# Patient Record
Sex: Male | Born: 1950 | Race: White | Hispanic: No | Marital: Married | Smoking: Never smoker
Health system: Southern US, Community
[De-identification: ages and names within clinical notes are randomized; demographics above are authoritative.]

## PROBLEM LIST (undated history)

## (undated) ENCOUNTER — Emergency Department: Admission: EM

## (undated) DIAGNOSIS — T8859XA Other complications of anesthesia, initial encounter: Secondary | ICD-10-CM

## (undated) DIAGNOSIS — I10 Essential (primary) hypertension: Secondary | ICD-10-CM

## (undated) DIAGNOSIS — M1990 Unspecified osteoarthritis, unspecified site: Secondary | ICD-10-CM

## (undated) DIAGNOSIS — R112 Nausea with vomiting, unspecified: Secondary | ICD-10-CM

## (undated) DIAGNOSIS — Z9889 Other specified postprocedural states: Secondary | ICD-10-CM

## (undated) DIAGNOSIS — T4145XA Adverse effect of unspecified anesthetic, initial encounter: Secondary | ICD-10-CM

## (undated) DIAGNOSIS — E039 Hypothyroidism, unspecified: Secondary | ICD-10-CM

## (undated) HISTORY — PX: CHOLECYSTECTOMY: SHX55

## (undated) HISTORY — PX: KNEE SURGERY: SHX244

## (undated) HISTORY — DX: Essential (primary) hypertension: I10

## (undated) HISTORY — PX: APPENDECTOMY: SHX54

## (undated) HISTORY — PX: SHOULDER SURGERY: SHX246

## (undated) HISTORY — PX: TOTAL SHOULDER REPLACEMENT: SUR1217

---
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
## 2003-10-27 ENCOUNTER — Other Ambulatory Visit: Payer: Self-pay

## 2005-01-15 ENCOUNTER — Ambulatory Visit: Payer: Self-pay | Admitting: Orthopaedic Surgery

## 2005-02-13 ENCOUNTER — Encounter: Payer: Self-pay | Admitting: Orthopaedic Surgery

## 2005-09-26 ENCOUNTER — Encounter: Payer: Self-pay | Admitting: Orthopaedic Surgery

## 2005-10-04 ENCOUNTER — Encounter: Payer: Self-pay | Admitting: Orthopaedic Surgery

## 2005-11-04 ENCOUNTER — Encounter: Payer: Self-pay | Admitting: Orthopaedic Surgery

## 2005-12-11 ENCOUNTER — Other Ambulatory Visit: Payer: Self-pay

## 2005-12-11 ENCOUNTER — Ambulatory Visit: Payer: Self-pay | Admitting: Internal Medicine

## 2006-02-18 ENCOUNTER — Other Ambulatory Visit: Payer: Self-pay

## 2006-02-18 ENCOUNTER — Ambulatory Visit: Payer: Self-pay | Admitting: Internal Medicine

## 2011-11-30 ENCOUNTER — Ambulatory Visit: Payer: Self-pay | Admitting: Unknown Physician Specialty

## 2012-02-11 ENCOUNTER — Encounter: Payer: Self-pay | Admitting: Orthopaedic Surgery

## 2012-03-06 ENCOUNTER — Encounter: Payer: Self-pay | Admitting: Orthopaedic Surgery

## 2012-05-20 ENCOUNTER — Ambulatory Visit: Payer: Self-pay | Admitting: Orthopaedic Surgery

## 2012-07-10 ENCOUNTER — Encounter: Payer: Self-pay | Admitting: Orthopaedic Surgery

## 2012-08-06 ENCOUNTER — Encounter: Payer: Self-pay | Admitting: Orthopaedic Surgery

## 2012-09-06 ENCOUNTER — Encounter: Payer: Self-pay | Admitting: Orthopaedic Surgery

## 2012-10-04 ENCOUNTER — Encounter: Payer: Self-pay | Admitting: Orthopaedic Surgery

## 2013-12-18 ENCOUNTER — Ambulatory Visit: Payer: Self-pay | Admitting: Orthopedic Surgery

## 2014-07-28 DIAGNOSIS — R079 Chest pain, unspecified: Secondary | ICD-10-CM | POA: Diagnosis not present

## 2014-07-28 DIAGNOSIS — I10 Essential (primary) hypertension: Secondary | ICD-10-CM | POA: Diagnosis not present

## 2014-08-09 ENCOUNTER — Ambulatory Visit: Payer: Self-pay | Admitting: Family Medicine

## 2014-08-09 DIAGNOSIS — I2510 Atherosclerotic heart disease of native coronary artery without angina pectoris: Secondary | ICD-10-CM | POA: Diagnosis not present

## 2014-08-09 DIAGNOSIS — R079 Chest pain, unspecified: Secondary | ICD-10-CM | POA: Diagnosis not present

## 2014-09-02 DIAGNOSIS — I10 Essential (primary) hypertension: Secondary | ICD-10-CM | POA: Diagnosis not present

## 2014-09-09 DIAGNOSIS — D485 Neoplasm of uncertain behavior of skin: Secondary | ICD-10-CM | POA: Diagnosis not present

## 2014-09-09 DIAGNOSIS — L821 Other seborrheic keratosis: Secondary | ICD-10-CM | POA: Diagnosis not present

## 2014-09-09 DIAGNOSIS — L578 Other skin changes due to chronic exposure to nonionizing radiation: Secondary | ICD-10-CM | POA: Diagnosis not present

## 2014-09-09 DIAGNOSIS — L570 Actinic keratosis: Secondary | ICD-10-CM | POA: Diagnosis not present

## 2014-09-09 DIAGNOSIS — X32XXXA Exposure to sunlight, initial encounter: Secondary | ICD-10-CM | POA: Diagnosis not present

## 2014-11-04 DIAGNOSIS — M7541 Impingement syndrome of right shoulder: Secondary | ICD-10-CM | POA: Diagnosis not present

## 2014-11-04 DIAGNOSIS — M19011 Primary osteoarthritis, right shoulder: Secondary | ICD-10-CM | POA: Diagnosis not present

## 2015-04-29 ENCOUNTER — Other Ambulatory Visit: Payer: Self-pay | Admitting: Family Medicine

## 2015-04-29 DIAGNOSIS — I10 Essential (primary) hypertension: Secondary | ICD-10-CM | POA: Insufficient documentation

## 2015-05-05 ENCOUNTER — Telehealth: Payer: Self-pay | Admitting: Family Medicine

## 2015-05-05 MED ORDER — LISINOPRIL 20 MG PO TABS: 20.0000 mg | ORAL_TABLET | Freq: Every day | ORAL | Status: DC

## 2015-05-05 NOTE — Telephone Encounter (Signed)
Pt called stated refill on Lisinopril. Pharm is [facility]. Thanks.

## 2015-05-05 NOTE — Telephone Encounter (Signed)
Forward to provider

## 2015-09-08 DIAGNOSIS — L821 Other seborrheic keratosis: Secondary | ICD-10-CM | POA: Diagnosis not present

## 2015-09-08 DIAGNOSIS — D2271 Melanocytic nevi of right lower limb, including hip: Secondary | ICD-10-CM | POA: Diagnosis not present

## 2015-09-08 DIAGNOSIS — X32XXXA Exposure to sunlight, initial encounter: Secondary | ICD-10-CM | POA: Diagnosis not present

## 2015-09-08 DIAGNOSIS — D225 Melanocytic nevi of trunk: Secondary | ICD-10-CM | POA: Diagnosis not present

## 2015-09-08 DIAGNOSIS — D2272 Melanocytic nevi of left lower limb, including hip: Secondary | ICD-10-CM | POA: Diagnosis not present

## 2015-09-08 DIAGNOSIS — L570 Actinic keratosis: Secondary | ICD-10-CM | POA: Diagnosis not present

## 2015-10-17 ENCOUNTER — Other Ambulatory Visit: Payer: Self-pay | Admitting: Family Medicine

## 2015-10-17 MED ORDER — TADALAFIL 5 MG PO TABS: 5.0000 mg | ORAL_TABLET | Freq: Every day | ORAL | Status: DC | PRN

## 2015-11-28 ENCOUNTER — Other Ambulatory Visit: Payer: Self-pay | Admitting: Family Medicine

## 2015-11-28 MED ORDER — LISINOPRIL 20 MG PO TABS: 20.0000 mg | ORAL_TABLET | Freq: Every day | ORAL | Status: DC

## 2016-01-05 ENCOUNTER — Encounter (INDEPENDENT_AMBULATORY_CARE_PROVIDER_SITE_OTHER): Payer: Self-pay | Admitting: Family Medicine

## 2016-01-05 DIAGNOSIS — Z0289 Encounter for other administrative examinations: Secondary | ICD-10-CM

## 2016-01-05 NOTE — Progress Notes (Unsigned)
FAA class 2 PE

## 2016-01-26 ENCOUNTER — Ambulatory Visit (INDEPENDENT_AMBULATORY_CARE_PROVIDER_SITE_OTHER): Payer: Medicare Other | Admitting: Family Medicine

## 2016-01-26 ENCOUNTER — Encounter: Payer: Self-pay | Admitting: Family Medicine

## 2016-01-26 VITALS — BP 138/79 | HR 60 | Temp 97.9°F | Ht 70.0 in | Wt 203.0 lb

## 2016-01-26 DIAGNOSIS — Z0000 Encounter for general adult medical examination without abnormal findings: Secondary | ICD-10-CM | POA: Diagnosis not present

## 2016-01-26 DIAGNOSIS — R5383 Other fatigue: Secondary | ICD-10-CM | POA: Diagnosis not present

## 2016-01-26 DIAGNOSIS — I10 Essential (primary) hypertension: Secondary | ICD-10-CM

## 2016-01-26 DIAGNOSIS — M25519 Pain in unspecified shoulder: Secondary | ICD-10-CM | POA: Insufficient documentation

## 2016-01-26 DIAGNOSIS — N400 Enlarged prostate without lower urinary tract symptoms: Secondary | ICD-10-CM | POA: Diagnosis not present

## 2016-01-26 LAB — URINALYSIS, ROUTINE W REFLEX MICROSCOPIC
Bilirubin, UA: NEGATIVE
GLUCOSE, UA: NEGATIVE
Ketones, UA: NEGATIVE
Nitrite, UA: NEGATIVE
PH UA: 6 (ref 5.0–7.5)
PROTEIN UA: NEGATIVE
SPEC GRAV UA: 1.02 (ref 1.005–1.030)
Urobilinogen, Ur: 0.2 mg/dL (ref 0.2–1.0)

## 2016-01-26 LAB — MICROSCOPIC EXAMINATION

## 2016-01-26 MED ORDER — LISINOPRIL 20 MG PO TABS: 20.0000 mg | ORAL_TABLET | Freq: Every day | ORAL | Status: DC

## 2016-01-26 MED ORDER — SILDENAFIL CITRATE 20 MG PO TABS: 20.0000 mg | ORAL_TABLET | Freq: Every day | ORAL | Status: DC | PRN

## 2016-01-26 NOTE — Assessment & Plan Note (Signed)
The current medical regimen is effective;  continue present plan and medications.  

## 2016-01-26 NOTE — Progress Notes (Signed)
BP 138/79 mmHg  Pulse 60  Temp(Src) 97.9 F (36.6 C)  Ht 5\' 10"  (1.778 m)  Wt 203 lb (92.08 kg)  BMI 29.13 kg/m2  SpO2 95%   Subjective:    Patient ID: Tyler Sharp, male    DOB: 04-15-1951, 65 y.o.   MRN: [id number]  HPI: Tyler Sharp is a 65 y.o. male  Chief Complaint  Patient presents with  . Annual Exam   Patient presents for his annual exam today. Doing very well overall.  HTN - BPs WNL when checked. No issues or concerns with lisinopril, taking medication faithfully Shoulder pain - chronic, following with orthopedics. Not yet to the point where he wants joint replacement. Fairly well controlled with ibuprofen as needed.   Relevant past medical, surgical, family and social history reviewed and updated as indicated. Interim medical history since our last visit reviewed. Allergies and medications reviewed and updated.  Review of Systems  Constitutional: Negative.   HENT: Negative.   Eyes: Negative.   Respiratory: Negative.   Cardiovascular: Negative.   Gastrointestinal: Negative.   Endocrine: Negative.   Genitourinary: Negative.   Musculoskeletal: Positive for arthralgias.  Skin: Negative.   Allergic/Immunologic: Negative.   Neurological: Negative.   Hematological: Negative.   Psychiatric/Behavioral: Negative.     Per HPI unless specifically indicated above     Objective:    BP 138/79 mmHg  Pulse 60  Temp(Src) 97.9 F (36.6 C)  Ht 5\' 10"  (1.778 m)  Wt 203 lb (92.08 kg)  BMI 29.13 kg/m2  SpO2 95%  Wt Readings from Last 3 Encounters:  01/26/16 203 lb (92.08 kg)  01/05/16 205 lb (92.987 kg)  09/02/14 199 lb (90.266 kg)    Physical Exam  Constitutional: He is oriented to person, place, and time. He appears well-developed and well-nourished.  HENT:  Head: Normocephalic and atraumatic.  Right Ear: External ear normal.  Left Ear: External ear normal.  Eyes: Conjunctivae and EOM are normal. Pupils are equal, round, and reactive to light.  Neck:  Normal range of motion. Neck supple.  Cardiovascular: Normal rate, regular rhythm, normal heart sounds and intact distal pulses.   Pulmonary/Chest: Effort normal and breath sounds normal.  Abdominal: Soft. Bowel sounds are normal. There is no splenomegaly or hepatomegaly.  Genitourinary: Rectum normal, prostate normal and penis normal.  Musculoskeletal: Normal range of motion.  Neurological: He is alert and oriented to person, place, and time. He has normal reflexes.  Skin: No rash noted. No erythema.  Psychiatric: He has a normal mood and affect. His behavior is normal. Judgment and thought content normal.    No results found for this or any previous visit.    Assessment & Plan:   Problem List Items Addressed This Visit      Cardiovascular and Mediastinum   Hypertension    The current medical regimen is effective;  continue present plan and medications.       Relevant Medications   lisinopril (PRINIVIL,ZESTRIL) 20 MG tablet   sildenafil (REVATIO) 20 MG tablet     Other   Shoulder pain    Other Visit Diagnoses    Well adult exam    -  Primary    Relevant Orders    CBC with Differential/Platelet    Comprehensive metabolic panel    Lipid Panel w/o Chol/HDL Ratio    PSA    TSH    Urinalysis, Routine w reflex microscopic (not at [facility])    Other fatigue  Relevant Orders    CBC with Differential/Platelet    TSH    Enlarged prostate        Relevant Orders    PSA    Healthcare maintenance        Relevant Orders    Hepatitis C Antibody        Follow up plan: Return in about 6 months (around 07/27/2016) for BMP.

## 2016-01-27 ENCOUNTER — Other Ambulatory Visit: Payer: Self-pay | Admitting: Family Medicine

## 2016-01-27 ENCOUNTER — Telehealth: Payer: Self-pay | Admitting: Family Medicine

## 2016-01-27 DIAGNOSIS — R7989 Other specified abnormal findings of blood chemistry: Secondary | ICD-10-CM

## 2016-01-27 LAB — CBC WITH DIFFERENTIAL/PLATELET
BASOS ABS: 0 10*3/uL (ref 0.0–0.2)
BASOS: 1 %
EOS (ABSOLUTE): 0.2 10*3/uL (ref 0.0–0.4)
Eos: 3 %
HEMOGLOBIN: 13.2 g/dL (ref 12.6–17.7)
Hematocrit: 40 % (ref 37.5–51.0)
IMMATURE GRANS (ABS): 0 10*3/uL (ref 0.0–0.1)
Immature Granulocytes: 0 %
LYMPHS ABS: 2.4 10*3/uL (ref 0.7–3.1)
Lymphs: 42 %
MCH: 30.2 pg (ref 26.6–33.0)
MCHC: 33 g/dL (ref 31.5–35.7)
MCV: 92 fL (ref 79–97)
MONOCYTES: 6 %
Monocytes Absolute: 0.3 10*3/uL (ref 0.1–0.9)
NEUTROS ABS: 2.8 10*3/uL (ref 1.4–7.0)
Neutrophils: 48 %
Platelets: 234 10*3/uL (ref 150–379)
RBC: 4.37 x10E6/uL (ref 4.14–5.80)
RDW: 13.6 % (ref 12.3–15.4)
WBC: 5.7 10*3/uL (ref 3.4–10.8)

## 2016-01-27 LAB — COMPREHENSIVE METABOLIC PANEL
A/G RATIO: 1.9 (ref 1.2–2.2)
ALBUMIN: 4.5 g/dL (ref 3.6–4.8)
ALK PHOS: 51 IU/L (ref 39–117)
ALT: 26 IU/L (ref 0–44)
AST: 30 IU/L (ref 0–40)
BILIRUBIN TOTAL: 0.3 mg/dL (ref 0.0–1.2)
BUN / CREAT RATIO: 19 (ref 10–24)
BUN: 17 mg/dL (ref 8–27)
CHLORIDE: 100 mmol/L (ref 96–106)
CO2: 22 mmol/L (ref 18–29)
Calcium: 9.5 mg/dL (ref 8.6–10.2)
Creatinine, Ser: 0.91 mg/dL (ref 0.76–1.27)
GFR calc non Af Amer: 88 mL/min/{1.73_m2} (ref >59)
GFR, EST AFRICAN AMERICAN: 101 mL/min/{1.73_m2} (ref >59)
GLOBULIN, TOTAL: 2.4 g/dL (ref 1.5–4.5)
GLUCOSE: 92 mg/dL (ref 65–99)
POTASSIUM: 5 mmol/L (ref 3.5–5.2)
SODIUM: 139 mmol/L (ref 134–144)
TOTAL PROTEIN: 6.9 g/dL (ref 6.0–8.5)

## 2016-01-27 LAB — LIPID PANEL W/O CHOL/HDL RATIO
CHOLESTEROL TOTAL: 196 mg/dL (ref 100–199)
HDL: 48 mg/dL (ref >39)
LDL Calculated: 119 mg/dL — ABNORMAL HIGH (ref 0–99)
Triglycerides: 146 mg/dL (ref 0–149)
VLDL CHOLESTEROL CAL: 29 mg/dL (ref 5–40)

## 2016-01-27 LAB — HEPATITIS C ANTIBODY

## 2016-01-27 LAB — PSA: Prostate Specific Ag, Serum: 1.9 ng/mL (ref 0.0–4.0)

## 2016-01-27 LAB — TSH: TSH: 6.41 u[IU]/mL — AB (ref 0.450–4.500)

## 2016-01-27 NOTE — Telephone Encounter (Signed)
Thyroid very mildly elevated, please have him come back for a lab visit in 2-3 months to recheck thyroid function. Thanks!

## 2016-01-31 NOTE — Telephone Encounter (Signed)
Have tried calling patient to let him know about his bloodwork but no answer and no VM available.

## 2016-02-01 NOTE — Telephone Encounter (Signed)
Spoke with patient, he will come in for labs sometime in September

## 2016-06-06 DIAGNOSIS — R208 Other disturbances of skin sensation: Secondary | ICD-10-CM | POA: Diagnosis not present

## 2016-06-06 DIAGNOSIS — L570 Actinic keratosis: Secondary | ICD-10-CM | POA: Diagnosis not present

## 2016-06-06 DIAGNOSIS — L820 Inflamed seborrheic keratosis: Secondary | ICD-10-CM | POA: Diagnosis not present

## 2016-06-11 ENCOUNTER — Other Ambulatory Visit: Payer: Medicare Other

## 2016-06-11 DIAGNOSIS — R946 Abnormal results of thyroid function studies: Secondary | ICD-10-CM | POA: Diagnosis not present

## 2016-06-11 DIAGNOSIS — R7989 Other specified abnormal findings of blood chemistry: Secondary | ICD-10-CM

## 2016-06-12 LAB — THYROID PANEL WITH TSH
Free Thyroxine Index: 1.3 (ref 1.2–4.9)
T3 UPTAKE RATIO: 24 % (ref 24–39)
T4, Total: 5.6 ug/dL (ref 4.5–12.0)
TSH: 6.2 u[IU]/mL — ABNORMAL HIGH (ref 0.450–4.500)

## 2016-06-13 ENCOUNTER — Telehealth: Payer: Self-pay | Admitting: Family Medicine

## 2016-06-13 NOTE — Telephone Encounter (Signed)
Please call pt and let him know that his thyroid is still mildly elevated. Dr. Jeananne Rama can discuss options at his upcoming follow up next month. As long as he is asymptomatic, no need to treat unless it worsens. Will continue to monitor.

## 2016-06-14 NOTE — Telephone Encounter (Signed)
Patient notified.  Explained to patient to call us if he experience's any new symptoms, but wait to treat till he see's Dr. Jeananne Rama next month. Patient understood.

## 2016-07-26 ENCOUNTER — Encounter: Payer: Self-pay | Admitting: Family Medicine

## 2016-07-26 ENCOUNTER — Ambulatory Visit (INDEPENDENT_AMBULATORY_CARE_PROVIDER_SITE_OTHER): Payer: Medicare Other | Admitting: Family Medicine

## 2016-07-26 VITALS — BP 138/85 | HR 52 | Temp 97.9°F | Wt 206.6 lb

## 2016-07-26 DIAGNOSIS — G8929 Other chronic pain: Secondary | ICD-10-CM

## 2016-07-26 DIAGNOSIS — I10 Essential (primary) hypertension: Secondary | ICD-10-CM | POA: Diagnosis not present

## 2016-07-26 DIAGNOSIS — M25512 Pain in left shoulder: Secondary | ICD-10-CM | POA: Diagnosis not present

## 2016-07-26 NOTE — Progress Notes (Signed)
   BP 138/85 (BP Location: Left Arm, Patient Position: Sitting, Cuff Size: Large)   Pulse (!) 52   Temp 97.9 F (36.6 C)   Wt 206 lb 9.6 oz (93.7 kg)   SpO2 100%   BMI 29.64 kg/m    Subjective:    Patient ID: Tyler Sharp, male    DOB: 06-Jul-1951, 65 y.o.   MRN: [id number]  HPI: Tyler Sharp is a 65 y.o. male  Chief Complaint  Patient presents with  . Hypertension  Patient doing well with no complaints from medications taken faithfully with good control of blood pressure No thyroid symptoms last TSH was coming down but still elevated.  Relevant past medical, surgical, family and social history reviewed and updated as indicated. Interim medical history since our last visit reviewed. Allergies and medications reviewed and updated.  Review of Systems  Constitutional: Negative.   Respiratory: Negative.   Cardiovascular: Negative.     Per HPI unless specifically indicated above     Objective:    BP 138/85 (BP Location: Left Arm, Patient Position: Sitting, Cuff Size: Large)   Pulse (!) 52   Temp 97.9 F (36.6 C)   Wt 206 lb 9.6 oz (93.7 kg)   SpO2 100%   BMI 29.64 kg/m   Wt Readings from Last 3 Encounters:  07/26/16 206 lb 9.6 oz (93.7 kg)  01/26/16 203 lb (92.1 kg)  01/05/16 205 lb (93 kg)    Physical Exam  Constitutional: He is oriented to person, place, and time. He appears well-developed and well-nourished. No distress.  HENT:  Head: Normocephalic and atraumatic.  Right Ear: Hearing normal.  Left Ear: Hearing normal.  Nose: Nose normal.  Eyes: Conjunctivae and lids are normal. Right eye exhibits no discharge. Left eye exhibits no discharge. No scleral icterus.  Cardiovascular: Normal rate, regular rhythm and normal heart sounds.   Pulmonary/Chest: Effort normal and breath sounds normal. No respiratory distress.  Musculoskeletal: Normal range of motion.  Neurological: He is alert and oriented to person, place, and time.  Skin: Skin is intact. No rash noted.    Psychiatric: He has a normal mood and affect. His speech is normal and behavior is normal. Judgment and thought content normal. Cognition and memory are normal.    Results for orders placed or performed in visit on 06/11/16  Thyroid Panel With TSH  Result Value Ref Range   TSH 6.200 (H) 0.450 - 4.500 uIU/mL   T4, Total 5.6 4.5 - 12.0 ug/dL   T3 Uptake Ratio 24 24 - 39 %   Free Thyroxine Index 1.3 1.2 - 4.9      Assessment & Plan:   Problem List Items Addressed This Visit      Cardiovascular and Mediastinum   Hypertension - Primary    The current medical regimen is effective;  continue present plan and medications.       Relevant Orders   Basic metabolic panel     Other   Shoulder pain    stable        Elevated TSH will repeat  on physical  Follow up plan: Return in about 6 months (around 01/24/2017) for Physical Exam.

## 2016-07-26 NOTE — Assessment & Plan Note (Signed)
stable °

## 2016-07-26 NOTE — Assessment & Plan Note (Signed)
The current medical regimen is effective;  continue present plan and medications.  

## 2016-07-27 LAB — BASIC METABOLIC PANEL
BUN / CREAT RATIO: 16 (ref 10–24)
BUN: 15 mg/dL (ref 8–27)
CO2: 24 mmol/L (ref 18–29)
CREATININE: 0.92 mg/dL (ref 0.76–1.27)
Calcium: 9.7 mg/dL (ref 8.6–10.2)
Chloride: 97 mmol/L (ref 96–106)
GFR calc Af Amer: 99 mL/min/{1.73_m2} (ref >59)
GFR, EST NON AFRICAN AMERICAN: 86 mL/min/{1.73_m2} (ref >59)
GLUCOSE: 106 mg/dL — AB (ref 65–99)
Potassium: 5.4 mmol/L — ABNORMAL HIGH (ref 3.5–5.2)
SODIUM: 136 mmol/L (ref 134–144)

## 2016-08-01 ENCOUNTER — Telehealth: Payer: Self-pay | Admitting: Family Medicine

## 2016-08-01 DIAGNOSIS — E875 Hyperkalemia: Secondary | ICD-10-CM

## 2016-08-01 NOTE — Telephone Encounter (Signed)
Please let him know that his potassium was a little high. I'd like him to avoid bananas, citrus and salt substitutes and come back in 2 weeks for a recheck. Order in.

## 2016-08-01 NOTE — Telephone Encounter (Signed)
Called and let patient know what Dr. Wynetta Emery said about labs.

## 2016-09-06 ENCOUNTER — Other Ambulatory Visit: Payer: Medicare Other

## 2016-09-06 DIAGNOSIS — L570 Actinic keratosis: Secondary | ICD-10-CM | POA: Diagnosis not present

## 2016-09-06 DIAGNOSIS — B351 Tinea unguium: Secondary | ICD-10-CM | POA: Diagnosis not present

## 2016-09-06 DIAGNOSIS — E875 Hyperkalemia: Secondary | ICD-10-CM

## 2016-09-06 DIAGNOSIS — X32XXXA Exposure to sunlight, initial encounter: Secondary | ICD-10-CM | POA: Diagnosis not present

## 2016-09-07 ENCOUNTER — Encounter: Payer: Self-pay | Admitting: Family Medicine

## 2016-09-07 LAB — BASIC METABOLIC PANEL
BUN / CREAT RATIO: 15 (ref 10–24)
BUN: 14 mg/dL (ref 8–27)
CHLORIDE: 101 mmol/L (ref 96–106)
CO2: 26 mmol/L (ref 18–29)
Calcium: 9.4 mg/dL (ref 8.6–10.2)
Creatinine, Ser: 0.92 mg/dL (ref 0.76–1.27)
GFR calc non Af Amer: 86 mL/min/{1.73_m2} (ref >59)
GFR, EST AFRICAN AMERICAN: 99 mL/min/{1.73_m2} (ref >59)
Glucose: 118 mg/dL — ABNORMAL HIGH (ref 65–99)
Potassium: 5.2 mmol/L (ref 3.5–5.2)
SODIUM: 141 mmol/L (ref 134–144)

## 2016-11-28 ENCOUNTER — Other Ambulatory Visit: Payer: Self-pay | Admitting: Family Medicine

## 2016-11-28 MED ORDER — NEOMYCIN-POLYMYXIN-HC 3.5-10000-1 OT SOLN: 4.0000 [drp] | Freq: Four times a day (QID) | OTIC | 1 refills | Status: DC

## 2016-12-14 ENCOUNTER — Other Ambulatory Visit: Payer: Self-pay | Admitting: Family Medicine

## 2016-12-14 MED ORDER — NEOMYCIN-POLYMYXIN-HC 3.5-10000-1 OT SOLN: 4.0000 [drp] | Freq: Four times a day (QID) | OTIC | 1 refills | Status: DC

## 2016-12-14 NOTE — Telephone Encounter (Signed)
Patient needs Dr Jeananne Rama to send in a script to [facility] for the ear drops.  Thanks  [phone]

## 2017-01-01 ENCOUNTER — Telehealth: Payer: Self-pay | Admitting: Family Medicine

## 2017-01-01 NOTE — Telephone Encounter (Signed)
Neomycin-Polymyxin-HC 4 drops Left Ear 4 times daily was sent to mail order pharmacy by rachel lane on 12/14/16

## 2017-01-01 NOTE — Telephone Encounter (Signed)
Patient called to check on a script for his ear drops that he said he had spoken with someone to get sent to his mail order pharmacy [facility].  I told him I would make sure Corey Skains received this message today to see if Dr Jeananne Rama could get this taken care of ASAP.  Thank you

## 2017-01-03 ENCOUNTER — Encounter: Payer: Self-pay | Admitting: Family Medicine

## 2017-01-03 ENCOUNTER — Ambulatory Visit (INDEPENDENT_AMBULATORY_CARE_PROVIDER_SITE_OTHER): Payer: Medicare Other | Admitting: Family Medicine

## 2017-01-03 VITALS — BP 138/82 | HR 78 | Temp 98.2°F | Wt 207.8 lb

## 2017-01-03 DIAGNOSIS — J019 Acute sinusitis, unspecified: Secondary | ICD-10-CM

## 2017-01-03 DIAGNOSIS — I10 Essential (primary) hypertension: Secondary | ICD-10-CM | POA: Diagnosis not present

## 2017-01-03 MED ORDER — FEXOFENADINE-PSEUDOEPHED ER 60-120 MG PO TB12: 1.0000 | ORAL_TABLET | Freq: Two times a day (BID) | ORAL | 1 refills | Status: DC

## 2017-01-03 MED ORDER — BENZONATATE 100 MG PO CAPS: 100.0000 mg | ORAL_CAPSULE | Freq: Two times a day (BID) | ORAL | 0 refills | Status: DC | PRN

## 2017-01-03 MED ORDER — AMOXICILLIN-POT CLAVULANATE 875-125 MG PO TABS: 1.0000 | ORAL_TABLET | Freq: Two times a day (BID) | ORAL | 0 refills | Status: DC

## 2017-01-03 NOTE — Assessment & Plan Note (Signed)
Discussed sinusitis care and treatment discuss aviation and implications and flight. Discuss use of medications including nasal rinse.

## 2017-01-03 NOTE — Progress Notes (Signed)
BP 138/82   Pulse 78   Temp 98.2 F (36.8 C) (Oral)   Wt 207 lb 12.8 oz (94.3 kg)   SpO2 97%   BMI 29.82 kg/m    Subjective:    Patient ID: Tyler Sharp, male    DOB: 1951/05/11, 66 y.o.   MRN: [id number]  HPI: Tyler Sharp is a 66 y.o. male  Chief Complaint  Patient presents with  . Otalgia  . Sore Throat  . Headache   Patient's sinus pressure congestion and facial pain is been ongoing for over a week getting worse. Has had some left earache for over a month. As tried some over-the-counter medications without any real relief. Otherwise blood pressures been doing well. Relevant past medical, surgical, family and social history reviewed and updated as indicated. Interim medical history since our last visit reviewed. Allergies and medications reviewed and updated.  Review of Systems  Constitutional: Positive for chills, diaphoresis and fatigue. Negative for fever.  HENT: Positive for congestion, ear pain, postnasal drip, rhinorrhea, sinus pain, sinus pressure, sore throat and voice change.   Respiratory: Positive for cough.   Cardiovascular: Negative.     Per HPI unless specifically indicated above     Objective:    BP 138/82   Pulse 78   Temp 98.2 F (36.8 C) (Oral)   Wt 207 lb 12.8 oz (94.3 kg)   SpO2 97%   BMI 29.82 kg/m   Wt Readings from Last 3 Encounters:  01/03/17 207 lb 12.8 oz (94.3 kg)  07/26/16 206 lb 9.6 oz (93.7 kg)  01/26/16 203 lb (92.1 kg)    Physical Exam  Constitutional: He is oriented to person, place, and time. He appears well-developed and well-nourished.  HENT:  Head: Normocephalic and atraumatic.  Right Ear: External ear normal.  Left Ear: External ear normal.  Mouth/Throat: Oropharyngeal exudate present.  Eyes: Conjunctivae and EOM are normal.  Neck: Normal range of motion.  Cardiovascular: Normal rate, regular rhythm and normal heart sounds.   Pulmonary/Chest: Effort normal and breath sounds normal.  Musculoskeletal: Normal  range of motion.  Neurological: He is alert and oriented to person, place, and time.  Skin: No erythema.  Psychiatric: He has a normal mood and affect. His behavior is normal. Judgment and thought content normal.    Results for orders placed or performed in visit on 62/83/15  Basic metabolic panel  Result Value Ref Range   Glucose 118 (H) 65 - 99 mg/dL   BUN 14 8 - 27 mg/dL   Creatinine, Ser 0.92 0.76 - 1.27 mg/dL   GFR calc non Af Amer 86 >59 mL/min/1.73   GFR calc Af Amer 99 >59 mL/min/1.73   BUN/Creatinine Ratio 15 10 - 24   Sodium 141 134 - 144 mmol/L   Potassium 5.2 3.5 - 5.2 mmol/L   Chloride 101 96 - 106 mmol/L   CO2 26 18 - 29 mmol/L   Calcium 9.4 8.6 - 10.2 mg/dL      Assessment & Plan:   Problem List Items Addressed This Visit      Cardiovascular and Mediastinum   Hypertension    The current medical regimen is effective;  continue present plan and medications.         Respiratory   Acute sinusitis - Primary    Discussed sinusitis care and treatment discuss aviation and implications and flight. Discuss use of medications including nasal rinse.      Relevant Medications   amoxicillin-clavulanate (AUGMENTIN) 875-125 MG  tablet   benzonatate (TESSALON) 100 MG capsule   fexofenadine-pseudoephedrine (ALLEGRA-D) 60-120 MG 12 hr tablet       Follow up plan: Return for As scheduled.

## 2017-01-03 NOTE — Assessment & Plan Note (Signed)
The current medical regimen is effective;  continue present plan and medications.  

## 2017-01-04 ENCOUNTER — Telehealth: Payer: Self-pay | Admitting: Family Medicine

## 2017-01-07 DIAGNOSIS — G5622 Lesion of ulnar nerve, left upper limb: Secondary | ICD-10-CM | POA: Diagnosis not present

## 2017-01-07 DIAGNOSIS — G5620 Lesion of ulnar nerve, unspecified upper limb: Secondary | ICD-10-CM | POA: Insufficient documentation

## 2017-01-10 ENCOUNTER — Ambulatory Visit (INDEPENDENT_AMBULATORY_CARE_PROVIDER_SITE_OTHER): Payer: Medicare Other

## 2017-01-10 VITALS — BP 122/64 | HR 68 | Temp 98.5°F | Resp 16 | Ht 70.0 in | Wt 205.4 lb

## 2017-01-10 DIAGNOSIS — Z0000 Encounter for general adult medical examination without abnormal findings: Secondary | ICD-10-CM

## 2017-01-10 NOTE — Progress Notes (Signed)
Subjective:   Tyler Sharp is a 66 y.o. male who presents for Medicare Annual/Subsequent preventive examination.  Review of Systems:   Cardiac Risk Factors include: male gender;advanced age (>56men, >81 women);hypertension     Objective:    Vitals: BP 122/64 (BP Location: Right Arm, Patient Position: Sitting)   Pulse 68   Temp 98.5 F (36.9 C)   Resp 16   Ht 5\' 10"  (1.778 m)   Wt 205 lb 6.4 oz (93.2 kg)   BMI 29.47 kg/m   Body mass index is 29.47 kg/m.  Tobacco History  Smoking Status  . Never Smoker  Smokeless Tobacco  . Never Used     Counseling given: Not Answered   Past Medical History:  Diagnosis Date  . Hypertension    Past Surgical History:  Procedure Laterality Date  . APPENDECTOMY    . CHOLECYSTECTOMY    . KNEE SURGERY Right   . SHOULDER SURGERY Left    Family History  Problem Relation Age of Onset  . Heart disease Mother   . Heart attack Mother   . Brain cancer Father   . Diabetes Sister    History  Sexual Activity  . Sexual activity: Not on file    Outpatient Encounter Prescriptions as of 01/10/2017  Medication Sig  . lisinopril (PRINIVIL,ZESTRIL) 20 MG tablet Take 1 tablet (20 mg total) by mouth daily.  . predniSONE (STERAPRED UNI-PAK 21 TAB) 5 MG (21) TBPK tablet prednisone 5 mg tablets in a dose pack  Take 1 dose pk by oral route.  . [DISCONTINUED] neomycin-polymyxin-hydrocortisone (CORTISPORIN) otic solution Place 4 drops into the left ear 4 (four) times daily.  . sildenafil (REVATIO) 20 MG tablet Take 1 tablet (20 mg total) by mouth daily as needed. (Patient not taking: Reported on 01/10/2017)  . tadalafil (CIALIS) 5 MG tablet Take 1 tablet (5 mg total) by mouth daily as needed for erectile dysfunction. (Patient not taking: Reported on 01/10/2017)  . [DISCONTINUED] amoxicillin-clavulanate (AUGMENTIN) 875-125 MG tablet Take 1 tablet by mouth 2 (two) times daily.  . [DISCONTINUED] benzonatate (TESSALON) 100 MG capsule Take 1 capsule (100 mg  total) by mouth 2 (two) times daily as needed for cough. (Patient not taking: Reported on 01/10/2017)  . [DISCONTINUED] fexofenadine-pseudoephedrine (ALLEGRA-D) 60-120 MG 12 hr tablet Take 1 tablet by mouth 2 (two) times daily. (Patient not taking: Reported on 01/10/2017)   No facility-administered encounter medications on file as of 01/10/2017.     Activities of Daily Living In your present state of health, do you have any difficulty performing the following activities: 01/10/2017 01/26/2016  Hearing? N N  Vision? N N  Difficulty concentrating or making decisions? N N  Walking or climbing stairs? N N  Dressing or bathing? N N  Doing errands, shopping? N N  Preparing Food and eating ? N -  Using the Toilet? N -  In the past six months, have you accidently leaked urine? N -  Do you have problems with loss of bowel control? N -  Managing your Medications? N -  Managing your Finances? N -  Housekeeping or managing your Housekeeping? N -  Some recent data might be hidden    Patient Care Team: Guadalupe Maple, MD as PCP - General (Family Medicine) Thornton Park, MD as Referring Physician (Orthopedic Surgery) Manya Silvas, MD (Gastroenterology)   Assessment:     Exercise Activities and Dietary recommendations Current Exercise Habits: The patient has a physically strenous job, but has no  regular exercise apart from work.  Goals    . Increase water intake          Recommend drinking at least 4-5 glasses of water.       Fall Risk Fall Risk  01/10/2017 01/03/2017 01/26/2016  Falls in the past year? No No No   Depression Screen PHQ 2/9 Scores 01/10/2017 01/10/2017 01/03/2017 01/26/2016  PHQ - 2 Score 0 0 0 0  PHQ- 9 Score 0 - - -    Cognitive Function     6CIT Screen 01/10/2017  What Year? 0 points  What month? 0 points  What time? 0 points  Count back from 20 0 points  Months in reverse 0 points  Repeat phrase 0 points  Total Score 0    Immunization History  Administered  Date(s) Administered  . Influenza-Unspecified 06/25/2016  . Td 11/25/2006   Screening Tests Health Maintenance  Topic Date Due  . TETANUS/TDAP  01/31/2017 (Originally 11/24/2016)  . PNA vac Low Risk Adult (1 of 2 - PCV13) 01/31/2017 (Originally 07/24/2014)  . INFLUENZA VACCINE  03/06/2017  . COLONOSCOPY  11/29/2021  . Hepatitis C Screening  Completed      Plan:    I have personally reviewed and addressed the Medicare Annual Wellness questionnaire and have noted the following in the patient's chart:  A. Medical and social history B. Use of alcohol, tobacco or illicit drugs  C. Current medications and supplements D. Functional ability and status E.  Nutritional status F.  Physical activity G. Advance directives H. List of other physicians I.  Hospitalizations, surgeries, and ER visits in previous 12 months J.  [place] such as hearing and vision if needed, cognitive and depression L. Referrals and appointments   In addition, I have reviewed and discussed with patient certain preventive protocols, quality metrics, and best practice recommendations. A written personalized care plan for preventive services as well as general preventive health recommendations were provided to patient.   Signed,  Tyler Aas, LPN Nurse Health Advisor   MD Recommendations: none  I, as supervising physician, have reviewed  the nurse health advisors Medicare wellness visit note for this patient and concur with the findings and recommendations listed above. Guadalupe Maple MD

## 2017-01-10 NOTE — Patient Instructions (Addendum)
Tyler Sharp , Thank you for taking time to come for your Medicare Wellness Visit. I appreciate your ongoing commitment to your health goals. Please review the following plan we discussed and let me know if I can assist you in the future.   Screening recommendations/referrals: Colonoscopy: completed 11/30/2011 Recommended yearly ophthalmology/optometry visit for glaucoma screening and checkup Recommended yearly dental visit for hygiene and checkup  Vaccinations: Influenza vaccine: up to date, due 06/2017 Pneumococcal vaccine: due now- declined  Tdap vaccine: up to date, check with your insurance company for coverage  Shingles vaccine: due, check with your insurance company for coverage    Advanced directives: Please bring a copy of your advanced directives at your convenience.   Conditions/risks identified: Recommend drinking at least 4-5 glasses of water.   Next appointment: Follow up with Dr.Crissman on 01/31/2017 at 1pm. Follow up in one year for your annual wellness exam.   Preventive Care 65 Years and Older, Male Preventive care refers to lifestyle choices and visits with your health care provider that can promote health and wellness. What does preventive care include?  A yearly physical exam. This is also called an annual well check.  Dental exams once or twice a year.  Routine eye exams. Ask your health care provider how often you should have your eyes checked.  Personal lifestyle choices, including:  Daily care of your teeth and gums.  Regular physical activity.  Eating a healthy diet.  Avoiding tobacco and drug use.  Limiting alcohol use.  Practicing safe sex.  Taking low doses of aspirin every day.  Taking vitamin and mineral supplements as recommended by your health care provider. What happens during an annual well check? The services and screenings done by your health care provider during your annual well check will depend on your age, overall health,  lifestyle risk factors, and family history of disease. Counseling  Your health care provider may ask you questions about your:  Alcohol use.  Tobacco use.  Drug use.  Emotional well-being.  Home and relationship well-being.  Sexual activity.  Eating habits.  History of falls.  Memory and ability to understand (cognition).  Work and work Statistician. Screening  You may have the following tests or measurements:  Height, weight, and BMI.  Blood pressure.  Lipid and cholesterol levels. These may be checked every 5 years, or more frequently if you are over 62 years old.  Skin check.  Lung cancer screening. You may have this screening every year starting at age 58 if you have a 30-pack-year history of smoking and currently smoke or have quit within the past 15 years.  Fecal occult blood test (FOBT) of the stool. You may have this test every year starting at age 3.  Flexible sigmoidoscopy or colonoscopy. You may have a sigmoidoscopy every 5 years or a colonoscopy every 10 years starting at age 69.  Prostate cancer screening. Recommendations will vary depending on your family history and other risks.  Hepatitis C blood test.  Hepatitis B blood test.  Sexually transmitted disease (STD) testing.  Diabetes screening. This is done by checking your blood sugar (glucose) after you have not eaten for a while (fasting). You may have this done every 1-3 years.  Abdominal aortic aneurysm (AAA) screening. You may need this if you are a current or former smoker.  Osteoporosis. You may be screened starting at age 67 if you are at high risk. Talk with your health care provider about your test results, treatment options, and if  necessary, the need for more tests. Vaccines  Your health care provider may recommend certain vaccines, such as:  Influenza vaccine. This is recommended every year.  Tetanus, diphtheria, and acellular pertussis (Tdap, Td) vaccine. You may need a Td booster  every 10 years.  Zoster vaccine. You may need this after age 75.  Pneumococcal 13-valent conjugate (PCV13) vaccine. One dose is recommended after age 33.  Pneumococcal polysaccharide (PPSV23) vaccine. One dose is recommended after age 12. Talk to your health care provider about which screenings and vaccines you need and how often you need them. This information is not intended to replace advice given to you by your health care provider. Make sure you discuss any questions you have with your health care provider. Document Released: 08/19/2015 Document Revised: 04/11/2016 Document Reviewed: 05/24/2015 Elsevier Interactive Patient Education  2017 [place] Prevention in the Home Falls can cause injuries. They can happen to people of all ages. There are many things you can do to make your home safe and to help prevent falls. What can I do on the outside of my home?  Regularly fix the edges of walkways and driveways and fix any cracks.  Remove anything that might make you trip as you walk through a door, such as a raised step or threshold.  Trim any bushes or trees on the path to your home.  Use bright outdoor lighting.  Clear any walking paths of anything that might make someone trip, such as rocks or tools.  Regularly check to see if handrails are loose or broken. Make sure that both sides of any steps have handrails.  Any raised decks and porches should have guardrails on the edges.  Have any leaves, snow, or ice cleared regularly.  Use sand or salt on walking paths during winter.  Clean up any spills in your garage right away. This includes oil or grease spills. What can I do in the bathroom?  Use night lights.  Install grab bars by the toilet and in the tub and shower. Do not use towel bars as grab bars.  Use non-skid mats or decals in the tub or shower.  If you need to sit down in the shower, use a plastic, non-slip stool.  Keep the floor dry. Clean up any  water that spills on the floor as soon as it happens.  Remove soap buildup in the tub or shower regularly.  Attach bath mats securely with double-sided non-slip rug tape.  Do not have throw rugs and other things on the floor that can make you trip. What can I do in the bedroom?  Use night lights.  Make sure that you have a light by your bed that is easy to reach.  Do not use any sheets or blankets that are too big for your bed. They should not hang down onto the floor.  Have a firm chair that has side arms. You can use this for support while you get dressed.  Do not have throw rugs and other things on the floor that can make you trip. What can I do in the kitchen?  Clean up any spills right away.  Avoid walking on wet floors.  Keep items that you use a lot in easy-to-reach places.  If you need to reach something above you, use a strong step stool that has a grab bar.  Keep electrical cords out of the way.  Do not use floor polish or wax that makes floors slippery. If you must  use wax, use non-skid floor wax.  Do not have throw rugs and other things on the floor that can make you trip. What can I do with my stairs?  Do not leave any items on the stairs.  Make sure that there are handrails on both sides of the stairs and use them. Fix handrails that are broken or loose. Make sure that handrails are as long as the stairways.  Check any carpeting to make sure that it is firmly attached to the stairs. Fix any carpet that is loose or worn.  Avoid having throw rugs at the top or bottom of the stairs. If you do have throw rugs, attach them to the floor with carpet tape.  Make sure that you have a light switch at the top of the stairs and the bottom of the stairs. If you do not have them, ask someone to add them for you. What else can I do to help prevent falls?  Wear shoes that:  Do not have high heels.  Have rubber bottoms.  Are comfortable and fit you well.  Are closed  at the toe. Do not wear sandals.  If you use a stepladder:  Make sure that it is fully opened. Do not climb a closed stepladder.  Make sure that both sides of the stepladder are locked into place.  Ask someone to hold it for you, if possible.  Clearly mark and make sure that you can see:  Any grab bars or handrails.  First and last steps.  Where the edge of each step is.  Use tools that help you move around (mobility aids) if they are needed. These include:  Canes.  Walkers.  Scooters.  Crutches.  Turn on the lights when you go into a dark area. Replace any light bulbs as soon as they burn out.  Set up your furniture so you have a clear path. Avoid moving your furniture around.  If any of your floors are uneven, fix them.  If there are any pets around you, be aware of where they are.  Review your medicines with your doctor. Some medicines can make you feel dizzy. This can increase your chance of falling. Ask your doctor what other things that you can do to help prevent falls. This information is not intended to   replace advice given to you by your health care provider. Make sure you discuss any questions you have with your health care provider. Document Released: 05/19/2009 Document Revised: 12/29/2015 Document Reviewed: 08/27/2014 Elsevier Interactive Patient Education  2017 [employer].

## 2017-01-29 ENCOUNTER — Other Ambulatory Visit: Payer: Self-pay | Admitting: Family Medicine

## 2017-01-29 MED ORDER — LISINOPRIL 20 MG PO TABS: 20.0000 mg | ORAL_TABLET | Freq: Every day | ORAL | 4 refills | Status: DC

## 2017-01-31 ENCOUNTER — Encounter: Payer: Medicare Other | Admitting: Family Medicine

## 2017-01-31 ENCOUNTER — Telehealth: Payer: Self-pay

## 2017-01-31 DIAGNOSIS — G5622 Lesion of ulnar nerve, left upper limb: Secondary | ICD-10-CM | POA: Diagnosis not present

## 2017-01-31 DIAGNOSIS — M5412 Radiculopathy, cervical region: Secondary | ICD-10-CM | POA: Diagnosis not present

## 2017-01-31 MED ORDER — VALACYCLOVIR HCL 500 MG PO TABS: 500.0000 mg | ORAL_TABLET | Freq: Two times a day (BID) | ORAL | 12 refills | Status: DC

## 2017-01-31 NOTE — Telephone Encounter (Signed)
Pt requesting refill on Valacyclovir 500 mg tablets. Stated he needs them due to fever blisters he gets from being in the sun. Did not see on current or historic med lists. Please advise.

## 2017-02-01 ENCOUNTER — Other Ambulatory Visit: Payer: Self-pay | Admitting: Orthopedic Surgery

## 2017-02-01 DIAGNOSIS — M5412 Radiculopathy, cervical region: Secondary | ICD-10-CM

## 2017-02-07 ENCOUNTER — Ambulatory Visit
Admission: RE | Admit: 2017-02-07 | Discharge: 2017-02-07 | Disposition: A | Discharge status: Home / Self Care | Payer: Medicare Other | Source: Ambulatory Visit | Attending: Orthopedic Surgery | Admitting: Orthopedic Surgery

## 2017-02-07 DIAGNOSIS — M4802 Spinal stenosis, cervical region: Secondary | ICD-10-CM | POA: Diagnosis not present

## 2017-02-07 DIAGNOSIS — M5412 Radiculopathy, cervical region: Secondary | ICD-10-CM | POA: Diagnosis present

## 2017-02-21 DIAGNOSIS — M5412 Radiculopathy, cervical region: Secondary | ICD-10-CM | POA: Diagnosis not present

## 2017-02-25 ENCOUNTER — Other Ambulatory Visit: Payer: Self-pay

## 2017-03-07 DIAGNOSIS — B351 Tinea unguium: Secondary | ICD-10-CM | POA: Diagnosis not present

## 2017-03-07 DIAGNOSIS — X32XXXA Exposure to sunlight, initial encounter: Secondary | ICD-10-CM | POA: Diagnosis not present

## 2017-03-07 DIAGNOSIS — L821 Other seborrheic keratosis: Secondary | ICD-10-CM | POA: Diagnosis not present

## 2017-03-07 DIAGNOSIS — L570 Actinic keratosis: Secondary | ICD-10-CM | POA: Diagnosis not present

## 2017-03-07 DIAGNOSIS — D225 Melanocytic nevi of trunk: Secondary | ICD-10-CM | POA: Diagnosis not present

## 2017-03-07 DIAGNOSIS — D2262 Melanocytic nevi of left upper limb, including shoulder: Secondary | ICD-10-CM | POA: Diagnosis not present

## 2017-05-16 ENCOUNTER — Ambulatory Visit (INDEPENDENT_AMBULATORY_CARE_PROVIDER_SITE_OTHER): Payer: Medicare Other | Admitting: Family Medicine

## 2017-05-16 ENCOUNTER — Encounter: Payer: Self-pay | Admitting: Family Medicine

## 2017-05-16 VITALS — BP 137/88 | HR 61 | Temp 97.8°F | Ht 69.9 in | Wt 207.6 lb

## 2017-05-16 DIAGNOSIS — G8929 Other chronic pain: Secondary | ICD-10-CM | POA: Diagnosis not present

## 2017-05-16 DIAGNOSIS — M25512 Pain in left shoulder: Secondary | ICD-10-CM | POA: Diagnosis not present

## 2017-05-16 DIAGNOSIS — I10 Essential (primary) hypertension: Secondary | ICD-10-CM

## 2017-05-16 DIAGNOSIS — Z7189 Other specified counseling: Secondary | ICD-10-CM | POA: Diagnosis not present

## 2017-05-16 DIAGNOSIS — N400 Enlarged prostate without lower urinary tract symptoms: Secondary | ICD-10-CM

## 2017-05-16 DIAGNOSIS — Z0000 Encounter for general adult medical examination without abnormal findings: Secondary | ICD-10-CM

## 2017-05-16 LAB — MICROSCOPIC EXAMINATION
Bacteria, UA: NONE SEEN
RBC MICROSCOPIC, UA: NONE SEEN /HPF (ref 0–?)

## 2017-05-16 LAB — URINALYSIS, ROUTINE W REFLEX MICROSCOPIC
Bilirubin, UA: NEGATIVE
Glucose, UA: NEGATIVE
Ketones, UA: NEGATIVE
NITRITE UA: NEGATIVE
Protein, UA: NEGATIVE
RBC, UA: NEGATIVE
Specific Gravity, UA: 1.02 (ref 1.005–1.030)
UUROB: 0.2 mg/dL (ref 0.2–1.0)
pH, UA: 6.5 (ref 5.0–7.5)

## 2017-05-16 MED ORDER — LISINOPRIL 20 MG PO TABS: 20.0000 mg | ORAL_TABLET | Freq: Every day | ORAL | 4 refills | Status: DC

## 2017-05-16 MED ORDER — SILDENAFIL CITRATE 20 MG PO TABS: 20.0000 mg | ORAL_TABLET | Freq: Every day | ORAL | 12 refills | Status: DC | PRN

## 2017-05-16 NOTE — Assessment & Plan Note (Signed)
The current medical regimen is effective;  continue present plan and medications.  

## 2017-05-16 NOTE — Assessment & Plan Note (Signed)
stable °

## 2017-05-16 NOTE — Assessment & Plan Note (Signed)
A voluntary discussion about advance care planning including the explanation and discussion of advance directives was extensively discussed  with the patient.  Explanation about the health care proxy and Living will was reviewed and packet with forms with explanation of how to fill them out was given.    

## 2017-05-16 NOTE — Progress Notes (Signed)
BP 137/88 (BP Location: Left Arm, Cuff Size: Normal)   Pulse 61   Temp 97.8 F (36.6 C)   Ht 5' 9.9" (1.775 m)   Wt 207 lb 9.6 oz (94.2 kg)   SpO2 99%   BMI 29.87 kg/m    Subjective:    Patient ID: Tyler Sharp, male    DOB: 1950-12-25, 66 y.o.   MRN: [id number]  HPI: Nigil Braman is a 66 y.o. male  Chief Complaint  Patient presents with  . Annual Exam    pt had wellness exam in June 2018  . Medication Refill    pt needs sildenafil prescription to go to [facility]   Patient follow-up also hypertension takes blood pressure medicine lisinopril without problems and faithfully. Blood pressure on home monitoring similar to here is high normal. Does no issues with medications. 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 Also bothered by various arthralgias. Takes Advil which seems to help Sildenafil also working okay.   Relevant past medical, surgical, family and social history reviewed and updated as indicated. Interim medical history since our last visit reviewed. Allergies and medications reviewed and updated.  Review of Systems  Constitutional: Negative.   HENT: Negative.   Eyes: Negative.   Respiratory: Negative.   Cardiovascular: Negative.   Gastrointestinal: Negative.   Endocrine: Negative.   Genitourinary: Negative.   Musculoskeletal: Negative.   Skin: Negative.   Allergic/Immunologic: Negative.   Neurological: Negative.   Hematological: Negative.   Psychiatric/Behavioral: Negative.     Per HPI unless specifically indicated above     Objective:    BP 137/88 (BP Location: Left Arm, Cuff Size: Normal)   Pulse 61   Temp 97.8 F (36.6 C)   Ht 5' 9.9" (1.775 m)   Wt 207 lb 9.6 oz  (94.2 kg)   SpO2 99%   BMI 29.87 kg/m   Wt Readings from Last 3 Encounters:  05/16/17 207 lb 9.6 oz (94.2 kg)  01/10/17 205 lb 6.4 oz (93.2 kg)  01/03/17 207 lb 12.8 oz (94.3 kg)    Physical Exam  Constitutional: He is oriented to person, place, and time. He appears well-developed and well-nourished.  HENT:  Head: Normocephalic and atraumatic.  Right Ear: External ear normal.  Left Ear: External ear normal.  Eyes: Pupils are equal, round, and reactive to light. Conjunctivae and EOM are normal.  Neck: Normal range of motion. Neck supple.  Cardiovascular: Normal rate, regular rhythm, normal heart sounds and intact distal pulses.   Pulmonary/Chest: Effort normal and breath sounds normal.  Abdominal: Soft. Bowel sounds are normal. There is no splenomegaly or hepatomegaly.  Genitourinary: Rectum normal and penis normal.  Genitourinary Comments: BPH changes  Musculoskeletal: Normal range of motion.  Neurological: He is alert and oriented to person, place, and time. He has normal reflexes.  Skin: No rash noted. No erythema.  Psychiatric: He has a normal mood and affect. His behavior is normal. Judgment and thought content normal.    Results for orders placed or performed in visit on 37/16/96  Basic metabolic panel  Result Value Ref Range   Glucose 118 (H) 65 - 99 mg/dL   BUN 14 8 - 27 mg/dL   Creatinine, Ser 0.92 0.76 - 1.27 mg/dL  GFR calc non Af Amer 86 >59 mL/min/1.73   GFR calc Af Amer 99 >59 mL/min/1.73   BUN/Creatinine Ratio 15 10 - 24   Sodium 141 134 - 144 mmol/L   Potassium 5.2 3.5 - 5.2 mmol/L   Chloride 101 96 - 106 mmol/L   CO2 26 18 - 29 mmol/L   Calcium 9.4 8.6 - 10.2 mg/dL      Assessment & Plan:   Problem List Items Addressed This Visit      Cardiovascular and Mediastinum   Hypertension    The current medical regimen is effective;  continue present plan and medications.       Relevant Medications   sildenafil (REVATIO) 20 MG tablet   lisinopril  (PRINIVIL,ZESTRIL) 20 MG tablet   Other Relevant Orders   CBC with Differential/Platelet   Comprehensive metabolic panel   Lipid panel   TSH   Urinalysis, Routine w reflex microscopic     Genitourinary   BPH (benign prostatic hyperplasia)    stable      Relevant Orders   PSA     Other   Shoulder pain    The current medical regimen is effective;  continue present plan and medications.       Relevant Orders   CBC with Differential/Platelet   Comprehensive metabolic panel   Lipid panel   TSH   Urinalysis, Routine w reflex microscopic   Advanced care planning/counseling discussion    A voluntary discussion about advance care planning including the explanation and discussion of advance directives was extensively discussed  with the patient.  Explanation about the health care proxy and Living will was reviewed and packet with forms with explanation of how to fill them out was given.          Other Visit Diagnoses    PE (physical exam), annual    -  Primary       Follow up plan: Return in about 6 months (around 11/14/2017) for BMP.

## 2017-05-17 LAB — CBC WITH DIFFERENTIAL/PLATELET
Basophils Absolute: 0 10*3/uL (ref 0.0–0.2)
Basos: 1 %
EOS (ABSOLUTE): 0.1 10*3/uL (ref 0.0–0.4)
EOS: 3 %
HEMATOCRIT: 39.5 % (ref 37.5–51.0)
Hemoglobin: 13.3 g/dL (ref 13.0–17.7)
Immature Grans (Abs): 0 10*3/uL (ref 0.0–0.1)
Immature Granulocytes: 0 %
LYMPHS ABS: 2.2 10*3/uL (ref 0.7–3.1)
Lymphs: 47 %
MCH: 31.6 pg (ref 26.6–33.0)
MCHC: 33.7 g/dL (ref 31.5–35.7)
MCV: 94 fL (ref 79–97)
MONOS ABS: 0.4 10*3/uL (ref 0.1–0.9)
Monocytes: 8 %
Neutrophils Absolute: 1.8 10*3/uL (ref 1.4–7.0)
Neutrophils: 41 %
Platelets: 212 10*3/uL (ref 150–379)
RBC: 4.21 x10E6/uL (ref 4.14–5.80)
RDW: 13.3 % (ref 12.3–15.4)
WBC: 4.4 10*3/uL (ref 3.4–10.8)

## 2017-05-17 LAB — COMPREHENSIVE METABOLIC PANEL
ALBUMIN: 4.8 g/dL (ref 3.6–4.8)
ALK PHOS: 56 IU/L (ref 39–117)
ALT: 30 IU/L (ref 0–44)
AST: 32 IU/L (ref 0–40)
Albumin/Globulin Ratio: 1.9 (ref 1.2–2.2)
BUN / CREAT RATIO: 16 (ref 10–24)
BUN: 16 mg/dL (ref 8–27)
Bilirubin Total: 0.6 mg/dL (ref 0.0–1.2)
CO2: 24 mmol/L (ref 20–29)
CREATININE: 1 mg/dL (ref 0.76–1.27)
Calcium: 9.7 mg/dL (ref 8.6–10.2)
Chloride: 104 mmol/L (ref 96–106)
GFR calc non Af Amer: 78 mL/min/{1.73_m2} (ref >59)
GFR, EST AFRICAN AMERICAN: 90 mL/min/{1.73_m2} (ref >59)
GLOBULIN, TOTAL: 2.5 g/dL (ref 1.5–4.5)
Glucose: 97 mg/dL (ref 65–99)
Potassium: 5 mmol/L (ref 3.5–5.2)
SODIUM: 143 mmol/L (ref 134–144)
TOTAL PROTEIN: 7.3 g/dL (ref 6.0–8.5)

## 2017-05-17 LAB — LIPID PANEL
CHOL/HDL RATIO: 4.3 ratio (ref 0.0–5.0)
CHOLESTEROL TOTAL: 193 mg/dL (ref 100–199)
HDL: 45 mg/dL (ref >39)
LDL CALC: 133 mg/dL — AB (ref 0–99)
Triglycerides: 77 mg/dL (ref 0–149)
VLDL Cholesterol Cal: 15 mg/dL (ref 5–40)

## 2017-05-17 LAB — TSH: TSH: 5.75 u[IU]/mL — AB (ref 0.450–4.500)

## 2017-05-17 LAB — PSA: PROSTATE SPECIFIC AG, SERUM: 2.1 ng/mL (ref 0.0–4.0)

## 2017-07-25 DIAGNOSIS — M19012 Primary osteoarthritis, left shoulder: Secondary | ICD-10-CM | POA: Diagnosis not present

## 2017-09-03 ENCOUNTER — Other Ambulatory Visit: Payer: Self-pay | Admitting: Orthopedic Surgery

## 2017-09-03 DIAGNOSIS — M19012 Primary osteoarthritis, left shoulder: Secondary | ICD-10-CM

## 2017-09-05 ENCOUNTER — Ambulatory Visit: Payer: Medicare Other

## 2017-09-20 ENCOUNTER — Ambulatory Visit
Admission: RE | Admit: 2017-09-20 | Discharge: 2017-09-20 | Disposition: A | Discharge status: Home / Self Care | Payer: Medicare Other | Source: Ambulatory Visit | Attending: Orthopedic Surgery | Admitting: Orthopedic Surgery

## 2017-09-20 DIAGNOSIS — M19012 Primary osteoarthritis, left shoulder: Secondary | ICD-10-CM | POA: Diagnosis not present

## 2017-09-20 DIAGNOSIS — M25512 Pain in left shoulder: Secondary | ICD-10-CM | POA: Diagnosis not present

## 2017-09-20 MED ORDER — METHYLPREDNISOLONE ACETATE 40 MG/ML INJ SUSP (RADIOLOG
80.0000 mg | Freq: Once | INTRAMUSCULAR | Status: AC
  Administered 2017-09-20: 80 mg via INTRA_ARTICULAR

## 2017-09-20 MED ORDER — LIDOCAINE HCL (PF) 1 % IJ SOLN
5.0000 mL | Freq: Once | INTRAMUSCULAR | Status: AC
  Administered 2017-09-20: 5 mL
  Filled 2017-09-20: qty 5

## 2017-09-20 MED ORDER — BUPIVACAINE HCL (PF) 0.25 % IJ SOLN
7.0000 mL | Freq: Once | INTRAMUSCULAR | Status: AC
  Administered 2017-09-20: 7 mL
  Filled 2017-09-20: qty 10

## 2017-09-20 MED ORDER — IOPAMIDOL (ISOVUE-200) INJECTION 41%
10.0000 mL | Freq: Once | INTRAVENOUS | Status: AC | PRN
  Administered 2017-09-20: 10 mL
  Filled 2017-09-20: qty 50

## 2017-11-14 ENCOUNTER — Encounter: Payer: Self-pay | Admitting: Family Medicine

## 2017-11-14 ENCOUNTER — Ambulatory Visit (INDEPENDENT_AMBULATORY_CARE_PROVIDER_SITE_OTHER): Payer: Medicare Other | Admitting: Family Medicine

## 2017-11-14 VITALS — BP 146/86 | HR 70 | Ht 70.0 in | Wt 212.0 lb

## 2017-11-14 DIAGNOSIS — M19019 Primary osteoarthritis, unspecified shoulder: Secondary | ICD-10-CM | POA: Insufficient documentation

## 2017-11-14 DIAGNOSIS — M25512 Pain in left shoulder: Secondary | ICD-10-CM

## 2017-11-14 DIAGNOSIS — I10 Essential (primary) hypertension: Secondary | ICD-10-CM

## 2017-11-14 DIAGNOSIS — G8929 Other chronic pain: Secondary | ICD-10-CM | POA: Diagnosis not present

## 2017-11-14 DIAGNOSIS — M7540 Impingement syndrome of unspecified shoulder: Secondary | ICD-10-CM | POA: Insufficient documentation

## 2017-11-14 NOTE — Assessment & Plan Note (Signed)
Discussed shoulder care and treatment following with orthopedics

## 2017-11-14 NOTE — Progress Notes (Signed)
BP (!) 146/86   Pulse 70   Ht 5\' 10"  (1.778 m)   Wt 212 lb (96.2 kg)   SpO2 98%   BMI 30.42 kg/m    Subjective:    Patient ID: Tyler Sharp, male    DOB: March 15, 1951, 67 y.o.   MRN: [id number]  HPI: Tyler Sharp is a 67 y.o. male  Chief Complaint  Patient presents with  . Follow-up  Hypertension good taking lisinopril 20 mg without problems. Concerned about elevated blood pressure reading as has been eating and drinking more than is customary may be exercising less. Sildenafil does good and wants to have refills.   Relevant past medical, surgical, family and social history reviewed and updated as indicated. Interim medical history since our last visit reviewed. Allergies and medications reviewed and updated.  Review of Systems  Constitutional: Negative.   Respiratory: Negative.   Cardiovascular: Negative.     Per HPI unless specifically indicated above     Objective:    BP (!) 146/86   Pulse 70   Ht 5\' 10"  (1.778 m)   Wt 212 lb (96.2 kg)   SpO2 98%   BMI 30.42 kg/m   Wt Readings from Last 3 Encounters:  11/14/17 212 lb (96.2 kg)  05/16/17 207 lb 9.6 oz (94.2 kg)  01/10/17 205 lb 6.4 oz (93.2 kg)    Physical Exam  Constitutional: He is oriented to person, place, and time. He appears well-developed and well-nourished. No distress.  HENT:  Head: Normocephalic and atraumatic.  Right Ear: Hearing normal.  Left Ear: Hearing normal.  Nose: Nose normal.  Eyes: Conjunctivae and lids are normal. Right eye exhibits no discharge. Left eye exhibits no discharge. No scleral icterus.  Cardiovascular: Normal rate, regular rhythm and normal heart sounds.  Pulmonary/Chest: Effort normal. No respiratory distress.  Musculoskeletal: Normal range of motion.  Neurological: He is alert and oriented to person, place, and time.  Skin: Skin is intact. No rash noted.  Psychiatric: He has a normal mood and affect. His speech is normal and behavior is normal. Judgment  and thought content normal. Cognition and memory are normal.    Results for orders placed or performed in visit on 05/16/17  Microscopic Examination  Result Value Ref Range   WBC, UA 0-5 0 - 5 /hpf   RBC, UA None seen 0 - 2 /hpf   Epithelial Cells (non renal) CANCELED    Bacteria, UA None seen None seen/Few  CBC with Differential/Platelet  Result Value Ref Range   WBC 4.4 3.4 - 10.8 x10E3/uL   RBC 4.21 4.14 - 5.80 x10E6/uL   Hemoglobin 13.3 13.0 - 17.7 g/dL   Hematocrit 39.5 37.5 - 51.0 %   MCV 94 79 - 97 fL   MCH 31.6 26.6 - 33.0 pg   MCHC 33.7 31.5 - 35.7 g/dL   RDW 13.3 12.3 - 15.4 %   Platelets 212 150 - 379 x10E3/uL   Neutrophils 41 Not Estab. %   Lymphs 47 Not Estab. %   Monocytes 8 Not Estab. %   Eos 3 Not Estab. %   Basos 1 Not Estab. %   Neutrophils Absolute 1.8 1.4 - 7.0 x10E3/uL   Lymphocytes Absolute 2.2 0.7 - 3.1 x10E3/uL   Monocytes Absolute 0.4 0.1 - 0.9 x10E3/uL   EOS (ABSOLUTE) 0.1 0.0 - 0.4 x10E3/uL   Basophils Absolute 0.0 0.0 - 0.2 x10E3/uL   Immature Granulocytes 0 Not Estab. %   Immature Grans (Abs) 0.0 0.0 - 0.1  x10E3/uL  Comprehensive metabolic panel  Result Value Ref Range   Glucose 97 65 - 99 mg/dL   BUN 16 8 - 27 mg/dL   Creatinine, Ser 1.00 0.76 - 1.27 mg/dL   GFR calc non Af Amer 78 >59 mL/min/1.73   GFR calc Af Amer 90 >59 mL/min/1.73   BUN/Creatinine Ratio 16 10 - 24   Sodium 143 134 - 144 mmol/L   Potassium 5.0 3.5 - 5.2 mmol/L   Chloride 104 96 - 106 mmol/L   CO2 24 20 - 29 mmol/L   Calcium 9.7 8.6 - 10.2 mg/dL   Total Protein 7.3 6.0 - 8.5 g/dL   Albumin 4.8 3.6 - 4.8 g/dL   Globulin, Total 2.5 1.5 - 4.5 g/dL   Albumin/Globulin Ratio 1.9 1.2 - 2.2   Bilirubin Total 0.6 0.0 - 1.2 mg/dL   Alkaline Phosphatase 56 39 - 117 IU/L   AST 32 0 - 40 IU/L   ALT 30 0 - 44 IU/L  Lipid panel  Result Value Ref Range   Cholesterol, Total 193 100 - 199 mg/dL   Triglycerides 77 0 - 149 mg/dL   HDL 45 >39 mg/dL   VLDL Cholesterol Cal 15 5 - 40  mg/dL   LDL Calculated 133 (H) 0 - 99 mg/dL   Chol/HDL Ratio 4.3 0.0 - 5.0 ratio  PSA  Result Value Ref Range   Prostate Specific Ag, Serum 2.1 0.0 - 4.0 ng/mL  TSH  Result Value Ref Range   TSH 5.750 (H) 0.450 - 4.500 uIU/mL  Urinalysis, Routine w reflex microscopic  Result Value Ref Range   Specific Gravity, UA 1.020 1.005 - 1.030   pH, UA 6.5 5.0 - 7.5   Color, UA Yellow Yellow   Appearance Ur Clear Clear   Leukocytes, UA Trace (A) Negative   Protein, UA Negative Negative/Trace   Glucose, UA Negative Negative   Ketones, UA Negative Negative   RBC, UA Negative Negative   Bilirubin, UA Negative Negative   Urobilinogen, Ur 0.2 0.2 - 1.0 mg/dL   Nitrite, UA Negative Negative   Microscopic Examination See below:       Assessment & Plan:   Problem List Items Addressed This Visit      Cardiovascular and Mediastinum   Hypertension - Primary    Poor control of blood pressure discussed adding medication patient reluctant wants to do better with diet exercise nutrition.  Reviewed optimal care discussed DASH diet etc. Recheck 1 month.      Relevant Orders   Basic metabolic panel     Other   Shoulder pain    Discussed shoulder care and treatment following with orthopedics          Follow up plan: Return in about 1 month (around 12/12/2017) for BP recheck.

## 2017-11-14 NOTE — Assessment & Plan Note (Signed)
Poor control of blood pressure discussed adding medication patient reluctant wants to do better with diet exercise nutrition.  Reviewed optimal care discussed DASH diet etc. Recheck 1 month.

## 2017-11-15 ENCOUNTER — Encounter: Payer: Self-pay | Admitting: Family Medicine

## 2017-11-15 LAB — BASIC METABOLIC PANEL
BUN/Creatinine Ratio: 14 (ref 10–24)
BUN: 13 mg/dL (ref 8–27)
CO2: 25 mmol/L (ref 20–29)
CREATININE: 0.92 mg/dL (ref 0.76–1.27)
Calcium: 9.6 mg/dL (ref 8.6–10.2)
Chloride: 102 mmol/L (ref 96–106)
GFR calc Af Amer: 98 mL/min/{1.73_m2} (ref >59)
GFR, EST NON AFRICAN AMERICAN: 85 mL/min/{1.73_m2} (ref >59)
GLUCOSE: 93 mg/dL (ref 65–99)
Potassium: 5.1 mmol/L (ref 3.5–5.2)
SODIUM: 139 mmol/L (ref 134–144)

## 2017-12-02 ENCOUNTER — Telehealth: Payer: Self-pay | Admitting: Family Medicine

## 2017-12-02 ENCOUNTER — Other Ambulatory Visit: Payer: Self-pay | Admitting: Family Medicine

## 2017-12-02 MED ORDER — MELOXICAM 15 MG PO TABS: 15.0000 mg | ORAL_TABLET | Freq: Every day | ORAL | 3 refills | Status: DC

## 2017-12-02 NOTE — Telephone Encounter (Signed)
Tyler Sharp, Pt has a question about   lisinopril (PRINIVIL,ZESTRIL) 20 MG tablet      And counteracting with Aleve and Advil that he takes for his shoulder pain. He read that it makes the Lisinopril less effective when taken together. Especially the naproxen. Could you please return his call. He is wanting to know what he should take. Thank you!

## 2017-12-02 NOTE — Telephone Encounter (Signed)
Please advise 

## 2018-01-01 ENCOUNTER — Ambulatory Visit: Payer: Medicare Other | Admitting: Family Medicine

## 2018-01-09 ENCOUNTER — Encounter: Payer: Self-pay | Admitting: Family Medicine

## 2018-01-09 ENCOUNTER — Ambulatory Visit (INDEPENDENT_AMBULATORY_CARE_PROVIDER_SITE_OTHER): Payer: Medicare Other | Admitting: Family Medicine

## 2018-01-09 VITALS — BP 172/107 | HR 90 | Ht 70.0 in | Wt 210.0 lb

## 2018-01-09 DIAGNOSIS — M25512 Pain in left shoulder: Secondary | ICD-10-CM | POA: Diagnosis not present

## 2018-01-09 DIAGNOSIS — G8929 Other chronic pain: Secondary | ICD-10-CM

## 2018-01-09 DIAGNOSIS — I10 Essential (primary) hypertension: Secondary | ICD-10-CM | POA: Diagnosis not present

## 2018-01-09 MED ORDER — AMLODIPINE BESYLATE 5 MG PO TABS: 5.0000 mg | ORAL_TABLET | Freq: Every day | ORAL | 3 refills | Status: DC

## 2018-01-09 NOTE — Assessment & Plan Note (Signed)
Poor control of hypertension will add amlodipine 5 mg to patient's regimen viewed possible side effects. Will recheck blood pressure starting in a couple of weeks will need for the [place] 3 blood pressure readings over a 7-day.  We will then redo flight physical.

## 2018-01-09 NOTE — Progress Notes (Addendum)
BP (!) 172/107   Pulse 90   Ht 5\' 10"  (1.778 m)   Wt 210 lb (95.3 kg)   BMI 30.13 kg/m    Subjective:    Patient ID: Tyler Sharp, male    DOB: Oct 25, 1950, 67 y.o.   MRN: [id number]  HPI: Tyler Sharp is a 67 y.o. male  Chief Complaint  Patient presents with  . 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 Due to patient's elevated blood pressure does not meet criteria to pass any flight physical. Visit was converted to hypertension check. Patient with marked elevation in blood pressure has been taking medications faithfully without problems.  Has been paying some attention to diet exercise nutrition.  Has not lost weight.   Relevant past medical, surgical, family and social history reviewed and updated as indicated. Interim medical history since our last visit reviewed. Allergies and medications reviewed and updated.  Review of Systems  Constitutional: Negative.   Respiratory: Negative.   Cardiovascular: Negative.     Per HPI unless specifically indicated above     Objective:    BP (!) 172/107   Pulse 90   Ht 5\' 10"  (1.778 m)   Wt 210 lb (95.3 kg)   BMI 30.13 kg/m   Wt Readings from Last 3 Encounters:  01/09/18 210 lb (95.3 kg)  11/14/17 212 lb (96.2 kg)  05/16/17 207 lb 9.6 oz (94.2 kg)    Physical Exam  Constitutional: He is oriented to person, place, and time. He appears well-developed and well-nourished.  HENT:  Head: Normocephalic and atraumatic.  Eyes: Conjunctivae and EOM are normal.  Neck: Normal range of motion.  Cardiovascular: Normal rate, regular rhythm and normal heart sounds.  Pulmonary/Chest: Effort normal and breath sounds normal.  Musculoskeletal: Normal range of motion.  Neurological: He is alert and oriented to person, place, and time.  Skin: No erythema.  Psychiatric: He has a normal mood and affect. His behavior is normal. Judgment and thought content normal.    Results for orders placed or performed  in visit on 49/70/26  Basic metabolic panel  Result Value Ref Range   Glucose 93 65 - 99 mg/dL   BUN 13 8 - 27 mg/dL   Creatinine, Ser 0.92 0.76 - 1.27 mg/dL   GFR calc non Af Amer 85 >59 mL/min/1.73   GFR calc Af Amer 98 >59 mL/min/1.73   BUN/Creatinine Ratio 14 10 - 24   Sodium 139 134 - 144 mmol/L   Potassium 5.1 3.5 - 5.2 mmol/L   Chloride 102 96 - 106 mmol/L   CO2 25 20 - 29 mmol/L   Calcium 9.6 8.6 - 10.2 mg/dL      Assessment & Plan:   Problem List Items Addressed This Visit      Cardiovascular and Mediastinum   Hypertension - Primary    Poor control of hypertension will add amlodipine 5 mg to patient's regimen viewed possible side effects. Will recheck blood pressure starting in a couple of weeks will need for the [place] 3 blood pressure readings over a 7-day.  We will then redo flight physical.      Relevant Medications   amLODipine (NORVASC) 5 MG tablet     Other   Shoulder pain    Patient's been taking ibuprofen for shoulder pain which is been doing okay.  Patient may continue ibuprofen.          Follow up plan: Return in about 1 month (around 02/06/2018).

## 2018-01-09 NOTE — Assessment & Plan Note (Signed)
Patient's been taking ibuprofen for shoulder pain which is been doing okay.  Patient may continue ibuprofen.

## 2018-01-09 NOTE — Addendum Note (Signed)
Addended by: Golden Pop A on: 01/09/2018 03:19 PM   Modules accepted: Level of Service

## 2018-01-13 ENCOUNTER — Ambulatory Visit (INDEPENDENT_AMBULATORY_CARE_PROVIDER_SITE_OTHER): Payer: Medicare Other

## 2018-01-13 VITALS — BP 150/92

## 2018-01-13 DIAGNOSIS — I10 Essential (primary) hypertension: Secondary | ICD-10-CM

## 2018-01-13 NOTE — Progress Notes (Signed)
Patient presented to clinic for BP check for FFA physical. Provider aware of reading.

## 2018-01-14 ENCOUNTER — Ambulatory Visit (INDEPENDENT_AMBULATORY_CARE_PROVIDER_SITE_OTHER): Payer: Medicare Other

## 2018-01-14 VITALS — BP 130/83

## 2018-01-14 DIAGNOSIS — I10 Essential (primary) hypertension: Secondary | ICD-10-CM

## 2018-01-14 NOTE — Progress Notes (Signed)
Patient stopped by for BP check.

## 2018-01-15 ENCOUNTER — Ambulatory Visit (INDEPENDENT_AMBULATORY_CARE_PROVIDER_SITE_OTHER): Payer: Medicare Other

## 2018-01-15 VITALS — BP 130/82 | HR 64 | Temp 97.7°F | Resp 16 | Ht 69.0 in | Wt 210.0 lb

## 2018-01-15 DIAGNOSIS — Z0000 Encounter for general adult medical examination without abnormal findings: Secondary | ICD-10-CM | POA: Diagnosis not present

## 2018-01-15 NOTE — Patient Instructions (Addendum)
[name] , Thank you for taking time to come for your Medicare Wellness Visit. I appreciate your ongoing commitment to your health goals. Please review the following plan we discussed and let me know if I can assist you in the future.   Screening recommendations/referrals: Colonoscopy: up to date Recommended yearly ophthalmology/optometry visit for glaucoma screening and checkup Recommended yearly dental visit for hygiene and checkup  Vaccinations: Influenza vaccine: up to date, due 04/2018 Pneumococcal vaccine: declined Tdap vaccine: declined Shingles vaccine: shingrix eligible, check with your insurance company for coverage   Advanced directives: Please bring a copy of your health care power of attorney and living will to the office at your convenience.  Conditions/risks identified: Recommend drinking at least 6-8 glasses of water a day   Next appointment: Follow up in one year for your annual wellness exam.   Preventive Care 65 Years and Older, Male Preventive care refers to lifestyle choices and visits with your health care provider that can promote health and wellness. What does preventive care include?  A yearly physical exam. This is also called an annual well check.  Dental exams once or twice a year.  Routine eye exams. Ask your health care provider how often you should have your eyes checked.  Personal lifestyle choices, including:  Daily care of your teeth and gums.  Regular physical activity.  Eating a healthy diet.  Avoiding tobacco and drug use.  Limiting alcohol use.  Practicing safe sex.  Taking low doses of aspirin every day.  Taking vitamin and mineral supplements as recommended by your health care provider. What happens during an annual well check? The services and screenings done by your health care provider during your annual well check will depend on your age, overall health, lifestyle risk factors, and family history of disease. Counseling    Your health care provider may ask you questions about your:  Alcohol use.  Tobacco use.  Drug use.  Emotional well-being.  Home and relationship well-being.  Sexual activity.  Eating habits.  History of falls.  Memory and ability to understand (cognition).  Work and work Statistician. Screening  You may have the following tests or measurements:  Height, weight, and BMI.  Blood pressure.  Lipid and cholesterol levels. These may be checked every 5 years, or more frequently if you are over 60 years old.  Skin check.  Lung cancer screening. You may have this screening every year starting at age 75 if you have a 30-pack-year history of smoking and currently smoke or have quit within the past 15 years.  Fecal occult blood test (FOBT) of the stool. You may have this test every year starting at age 44.  Flexible sigmoidoscopy or colonoscopy. You may have a sigmoidoscopy every 5 years or a colonoscopy every 10 years starting at age 32.  Prostate cancer screening. Recommendations will vary depending on your family history and other risks.  Hepatitis C blood test.  Hepatitis B blood test.  Sexually transmitted disease (STD) testing.  Diabetes screening. This is done by checking your blood sugar (glucose) after you have not eaten for a while (fasting). You may have this done every 1-3 years.  Abdominal aortic aneurysm (AAA) screening. You may need this if you are a current or former smoker.  Osteoporosis. You may be screened starting at age 100 if you are at high risk. Talk with your health care provider about your test results, treatment options, and if necessary, the need for more tests. Vaccines  Your  health care provider may recommend certain vaccines, such as:  Influenza vaccine. This is recommended every year.  Tetanus, diphtheria, and acellular pertussis (Tdap, Td) vaccine. You may need a Td booster every 10 years.  Zoster vaccine. You may need this after age  9.  Pneumococcal 13-valent conjugate (PCV13) vaccine. One dose is recommended after age 54.  Pneumococcal polysaccharide (PPSV23) vaccine. One dose is recommended after age 68. Talk to your health care provider about which screenings and vaccines you need and how often you need them. This information is not intended to replace advice given to you by your health care provider. Make sure you discuss any questions you have with your health care provider. Document Released: 08/19/2015 Document Revised: 04/11/2016 Document Reviewed: 05/24/2015 Elsevier Interactive Patient Education  2017 [place] Prevention in the Home Falls can cause injuries. They can happen to people of all ages. There are many things you can do to make your home safe and to help prevent falls. What can I do on the outside of my home?  Regularly fix the edges of walkways and driveways and fix any cracks.  Remove anything that might make you trip as you walk through a door, such as a raised step or threshold.  Trim any bushes or trees on the path to your home.  Use bright outdoor lighting.  Clear any walking paths of anything that might make someone trip, such as rocks or tools.  Regularly check to see if handrails are loose or broken. Make sure that both sides of any steps have handrails.  Any raised decks and porches should have guardrails on the edges.  Have any leaves, snow, or ice cleared regularly.  Use sand or salt on walking paths during winter.  Clean up any spills in your garage right away. This includes oil or grease spills. What can I do in the bathroom?  Use night lights.  Install grab bars by the toilet and in the tub and shower. Do not use towel bars as grab bars.  Use non-skid mats or decals in the tub or shower.  If you need to sit down in the shower, use a plastic, non-slip stool.  Keep the floor dry. Clean up any water that spills on the floor as soon as it happens.  Remove  soap buildup in the tub or shower regularly.  Attach bath mats securely with double-sided non-slip rug tape.  Do not have throw rugs and other things on the floor that can make you trip. What can I do in the bedroom?  Use night lights.  Make sure that you have a light by your bed that is easy to reach.  Do not use any sheets or blankets that are too big for your bed. They should not hang down onto the floor.  Have a firm chair that has side arms. You can use this for support while you get dressed.  Do not have throw rugs and other things on the floor that can make you trip. What can I do in the kitchen?  Clean up any spills right away.  Avoid walking on wet floors.  Keep items that you use a lot in easy-to-reach places.  If you need to reach something above you, use a strong step stool that has a grab bar.  Keep electrical cords out of the way.  Do not use floor polish or wax that makes floors slippery. If you must use wax, use non-skid floor wax.  Do not  have throw rugs and other things on the floor that can make you trip. What can I do with my stairs?  Do not leave any items on the stairs.  Make sure that there are handrails on both sides of the stairs and use them. Fix handrails that are broken or loose. Make sure that handrails are as long as the stairways.  Check any carpeting to make sure that it is firmly attached to the stairs. Fix any carpet that is loose or worn.  Avoid having throw rugs at the top or bottom of the stairs. If you do have throw rugs, attach them to the floor with carpet tape.  Make sure that you have a light switch at the top of the stairs and the bottom of the stairs. If you do not have them, ask someone to add them for you. What else can I do to help prevent falls?  Wear shoes that:  Do not have high heels.  Have rubber bottoms.  Are comfortable and fit you well.  Are closed at the toe. Do not wear sandals.  If you use a  stepladder:  Make sure that it is fully opened. Do not climb a closed stepladder.  Make sure that both sides of the stepladder are locked into place.  Ask someone to hold it for you, if possible.  Clearly mark and make sure that you can see:  Any grab bars or handrails.  First and last steps.  Where the edge of each step is.  Use tools that help you move around (mobility aids) if they are needed. These include:  Canes.  Walkers.  Scooters.  Crutches.  Turn on the lights when you go into a dark area. Replace any light bulbs as soon as they burn out.  Set up your furniture so you have a clear path. Avoid moving your furniture around.  If any of your floors are uneven, fix them.  If there are any pets around you, be aware of where they are.  Review your medicines with your doctor. Some medicines can make you feel dizzy. This can increase your chance of falling. Ask your doctor what other things that you can do to help prevent falls. This information is not intended to replace advice given to you by your health care provider. Make sure you discuss any questions you have with your health care provider. Document Released: 05/19/2009 Document Revised: 12/29/2015 Document Reviewed: 08/27/2014 Elsevier Interactive Patient Education  2017 [employer].

## 2018-01-15 NOTE — Progress Notes (Signed)
Subjective:   Tyler Sharp is a 67 y.o. male who presents for Medicare Annual/Subsequent preventive examination.  Review of Systems:   Cardiac Risk Factors include: hypertension;advanced age (>62men, >46 women);obesity (BMI >30kg/m2)     Objective:    Vitals: BP 130/82 (BP Location: Left Arm, Patient Position: Sitting)   Pulse 64   Temp 97.7 F (36.5 C) (Temporal)   Resp 16   Ht 5\' 9"  (1.753 m)   Wt 210 lb (95.3 kg)   SpO2 98%   BMI 31.01 kg/m   Body mass index is 31.01 kg/m.  Advanced Directives 01/15/2018 01/10/2017  Does Patient Have a Medical Advance Directive? Yes Yes  Type of Paramedic of [place];Living will [place];Living will  Copy of [place] in Chart? No - copy requested No - copy requested    Tobacco Social History   Tobacco Use  Smoking Status Never Smoker  Smokeless Tobacco Never Used     Counseling given: Not Answered   Clinical Intake:  Pre-visit preparation completed: Yes  Pain : No/denies pain     Nutritional Status: BMI > 30  Obese Nutritional Risks: None Diabetes: No  How often do you need to have someone help you when you read instructions, pamphlets, or other written materials from your doctor or pharmacy?: 1 - Never What is the last grade level you completed in school?: college  Interpreter Needed?: No  Information entered by :: Kree Rafter,LPN   Past Medical History:  Diagnosis Date  . Hypertension    Past Surgical History:  Procedure Laterality Date  . APPENDECTOMY    . CHOLECYSTECTOMY    . KNEE SURGERY Right   . SHOULDER SURGERY Left    Family History  Problem Relation Age of Onset  . Heart disease Mother   . Heart attack Mother   . Brain cancer Father   . Diabetes Sister    Social History   Socioeconomic History  . Marital status: Married    Spouse name: Not on file  . Number of children: Not on file  . Years of education: Not on file    . Highest education level: Not on file  Occupational History  . Not on file  Social Needs  . Financial resource strain: Not hard at all  . Food insecurity:    Worry: Never true    Inability: Never true  . Transportation needs:    Medical: No    Non-medical: No  Tobacco Use  . Smoking status: Never Smoker  . Smokeless tobacco: Never Used  Substance and Sexual Activity  . Alcohol use: Yes    Alcohol/week: 1.2 oz    Types: 2 Shots of liquor per week    Comment: occasional  . Drug use: No  . Sexual activity: Not on file  Lifestyle  . Physical activity:    Days per week: 0 days    Minutes per session: 0 min  . Stress: Not at all  Relationships  . Social connections:    Talks on phone: More than three times a week    Gets together: More than three times a week    Attends religious service: More than 4 times per year    Active member of club or organization: Yes    Attends meetings of clubs or organizations: More than 4 times per year    Relationship status: Married  Other Topics Concern  . Not on file  Social History Narrative  .  Not on file    Outpatient Encounter Medications as of 01/15/2018  Medication Sig  . amLODipine (NORVASC) 5 MG tablet Take 1 tablet (5 mg total) by mouth daily.  Marland Kitchen lisinopril (PRINIVIL,ZESTRIL) 20 MG tablet Take 1 tablet (20 mg total) by mouth daily.  . sildenafil (REVATIO) 20 MG tablet Take 1 tablet (20 mg total) by mouth daily as needed.   No facility-administered encounter medications on file as of 01/15/2018.     Activities of Daily Living In your present state of health, do you have any difficulty performing the following activities: 01/15/2018 05/16/2017  Hearing? N N  Vision? N N  Difficulty concentrating or making decisions? N N  Walking or climbing stairs? N N  Dressing or bathing? N N  Doing errands, shopping? N N  Preparing Food and eating ? N -  Using the Toilet? N -  In the past six months, have you accidently leaked urine? N -   Do you have problems with loss of bowel control? N -  Managing your Medications? N -  Managing your Finances? N -  Housekeeping or managing your Housekeeping? N -  Some recent data might be hidden    Patient Care Team: Guadalupe Maple, MD as PCP - General (Family Medicine) Thornton Park, MD as Referring Physician (Orthopedic Surgery) Manya Silvas, MD (Gastroenterology)   Assessment:   This is a routine wellness examination for Tyler Sharp.  Exercise Activities and Dietary recommendations Current Exercise Habits: Home exercise routine, Type of exercise: treadmill;strength training/weights, Time (Minutes): 45, Frequency (Times/Week): 3, Weekly Exercise (Minutes/Week): 135, Intensity: Moderate, Exercise limited by: None identified  Goals    . DIET - INCREASE WATER INTAKE     Recommend drinking at least 6-8 glasses of water a day        Fall Risk Fall Risk  01/15/2018 11/14/2017 11/14/2017 02/25/2017 01/10/2017  Falls in the past year? No No No Yes No  Comment - - - Emmi Telephone Survey: data to providers prior to load -  Number falls in past yr: - - - 1 -  Comment - - - Emmi Telephone Survey Actual Response = 1 -  Injury with Fall? - - - No -   Is the patient's home free of loose throw rugs in walkways, pet beds, electrical cords, etc?   no      Grab bars in the bathroom? No       Handrails on the stairs?   yes      Adequate lighting?   no  Timed Get Up and Go Performed: Completed in 8 seconds with no use of assistive devices, steady gait. No intervention needed at this time.   Depression Screen PHQ 2/9 Scores 01/15/2018 11/14/2017 11/14/2017 01/10/2017  PHQ - 2 Score 0 0 0 0  PHQ- 9 Score - - - 0    Cognitive Function     6CIT Screen 01/15/2018 01/10/2017  What Year? 0 points 0 points  What month? 0 points 0 points  What time? 0 points 0 points  Count back from 20 0 points 0 points  Months in reverse 0 points 0 points  Repeat phrase 0 points 0 points  Total Score 0 0     Immunization History  Administered Date(s) Administered  . Influenza-Unspecified 06/25/2016, 04/23/2017  . Td 11/25/2006    Qualifies for Shingles Vaccine? Yes, discussed shingrix vaccine   Screening Tests Health Maintenance  Topic Date Due  . TETANUS/TDAP  05/16/2018 (Originally 11/24/2016)  .  PNA vac Low Risk Adult (1 of 2 - PCV13) 05/16/2018 (Originally 07/24/2014)  . INFLUENZA VACCINE  03/06/2018  . COLONOSCOPY  11/29/2021  . Hepatitis C Screening  Completed   Cancer Screenings: Lung: Low Dose CT Chest recommended if Age 52-80 years, 30 pack-year currently smoking OR have quit w/in 15years. Patient does not qualify. Colorectal: completed 11/30/2011  Additional Screenings: Hepatitis C Screening: completed 01/26/2016      Plan:  I have personally reviewed and addressed the Medicare Annual Wellness questionnaire and have noted the following in the patient's chart:  A. Medical and social history B. Use of alcohol, tobacco or illicit drugs  C. Current medications and supplements D. Functional ability and status E.  Nutritional status F.  Physical activity G. Advance directives H. List of other physicians I.  Hospitalizations, surgeries, and ER visits in previous 12 months J.  [place] such as hearing and vision if needed, cognitive and depression L. Referrals and appointments   In addition, I have reviewed and discussed with patient certain preventive protocols, quality metrics, and best practice recommendations. A written personalized care plan for preventive services as well as general preventive health recommendations were provided to patient.   Signed,  Tyler Aas, LPN Nurse Health Advisor   Nurse Notes:none

## 2018-01-16 DIAGNOSIS — D225 Melanocytic nevi of trunk: Secondary | ICD-10-CM | POA: Diagnosis not present

## 2018-01-16 DIAGNOSIS — D2272 Melanocytic nevi of left lower limb, including hip: Secondary | ICD-10-CM | POA: Diagnosis not present

## 2018-01-16 DIAGNOSIS — D2261 Melanocytic nevi of right upper limb, including shoulder: Secondary | ICD-10-CM | POA: Diagnosis not present

## 2018-01-16 DIAGNOSIS — D2262 Melanocytic nevi of left upper limb, including shoulder: Secondary | ICD-10-CM | POA: Diagnosis not present

## 2018-01-16 DIAGNOSIS — D2271 Melanocytic nevi of right lower limb, including hip: Secondary | ICD-10-CM | POA: Diagnosis not present

## 2018-01-16 DIAGNOSIS — L570 Actinic keratosis: Secondary | ICD-10-CM | POA: Diagnosis not present

## 2018-01-16 DIAGNOSIS — Z872 Personal history of diseases of the skin and subcutaneous tissue: Secondary | ICD-10-CM | POA: Diagnosis not present

## 2018-01-16 DIAGNOSIS — C44712 Basal cell carcinoma of skin of right lower limb, including hip: Secondary | ICD-10-CM | POA: Diagnosis not present

## 2018-01-16 DIAGNOSIS — D485 Neoplasm of uncertain behavior of skin: Secondary | ICD-10-CM | POA: Diagnosis not present

## 2018-01-16 DIAGNOSIS — X32XXXA Exposure to sunlight, initial encounter: Secondary | ICD-10-CM | POA: Diagnosis not present

## 2018-01-20 DIAGNOSIS — M19012 Primary osteoarthritis, left shoulder: Secondary | ICD-10-CM | POA: Diagnosis not present

## 2018-01-28 ENCOUNTER — Other Ambulatory Visit: Payer: Self-pay | Admitting: Orthopedic Surgery

## 2018-01-28 DIAGNOSIS — M25512 Pain in left shoulder: Secondary | ICD-10-CM

## 2018-02-04 ENCOUNTER — Ambulatory Visit (INDEPENDENT_AMBULATORY_CARE_PROVIDER_SITE_OTHER): Payer: Self-pay | Admitting: Family Medicine

## 2018-02-04 ENCOUNTER — Encounter: Payer: Self-pay | Admitting: Family Medicine

## 2018-02-04 VITALS — BP 139/88 | HR 74 | Ht 70.0 in | Wt 210.0 lb

## 2018-02-04 DIAGNOSIS — I10 Essential (primary) hypertension: Secondary | ICD-10-CM

## 2018-02-04 MED ORDER — AMLODIPINE BESYLATE 5 MG PO TABS: 5.0000 mg | ORAL_TABLET | Freq: Every day | ORAL | 3 refills | Status: DC

## 2018-02-04 NOTE — Progress Notes (Signed)
   BP 139/88   Pulse 74   Ht 5\' 10"  (1.778 m)   Wt 210 lb (95.3 kg)   SpO2 96%   BMI 30.13 kg/m    Subjective:    Patient ID: Tyler Sharp, male    DOB: 16-Feb-1951, 67 y.o.   MRN: [id number]  HPI: Tyler Sharp is a 67 y.o. male  Chief Complaint  Patient presents with  . FAA    Class 3  BP f/u  Patient also follow-up blood pressure doing well with lisinopril and amlodipine no side effects taking medications faithfully without problems.  Relevant past medical, surgical, family and social history reviewed and updated as indicated. Interim medical history since our last visit reviewed. Allergies and medications reviewed and updated.  Review of Systems  Constitutional: Negative.   HENT: Negative.   Eyes: Negative.   Respiratory: Negative.   Cardiovascular: Negative.   Gastrointestinal: Negative.   Endocrine: Negative.   Genitourinary: Negative.   Musculoskeletal: Negative.   Skin: Negative.   Allergic/Immunologic: Negative.   Neurological: Negative.   Hematological: Negative.   Psychiatric/Behavioral: Negative.     Per HPI unless specifically indicated above     Objective:    BP 139/88   Pulse 74   Ht 5\' 10"  (1.778 m)   Wt 210 lb (95.3 kg)   SpO2 96%   BMI 30.13 kg/m   Wt Readings from Last 3 Encounters:  02/04/18 210 lb (95.3 kg)  01/15/18 210 lb (95.3 kg)  01/09/18 210 lb (95.3 kg)    Physical Exam  Constitutional: He is oriented to person, place, and time. He appears well-developed and well-nourished.  HENT:  Head: Normocephalic.  Right Ear: External ear normal.  Left Ear: External ear normal.  Nose: Nose normal.  Eyes: Pupils are equal, round, and reactive to light. Conjunctivae and EOM are normal.  Neck: Normal range of motion. Neck supple. No thyromegaly present.  Cardiovascular: Normal rate, regular rhythm, normal heart sounds and intact distal pulses.  Pulmonary/Chest: Effort normal and breath sounds normal.  Abdominal: Soft. Bowel  sounds are normal. There is no splenomegaly or hepatomegaly.  Genitourinary: Penis normal.  Musculoskeletal: Normal range of motion.  Lymphadenopathy:    He has no cervical adenopathy.  Neurological: He is alert and oriented to person, place, and time. He has normal reflexes.  Skin: Skin is warm and dry.  Psychiatric: He has a normal mood and affect. His behavior is normal. Judgment and thought content normal.    Results for orders placed or performed in visit on 91/63/84  Basic metabolic panel  Result Value Ref Range   Glucose 93 65 - 99 mg/dL   BUN 13 8 - 27 mg/dL   Creatinine, Ser 0.92 0.76 - 1.27 mg/dL   GFR calc non Af Amer 85 >59 mL/min/1.73   GFR calc Af Amer 98 >59 mL/min/1.73   BUN/Creatinine Ratio 14 10 - 24   Sodium 139 134 - 144 mmol/L   Potassium 5.1 3.5 - 5.2 mmol/L   Chloride 102 96 - 106 mmol/L   CO2 25 20 - 29 mmol/L   Calcium 9.6 8.6 - 10.2 mg/dL      Assessment & Plan:   Problem List Items Addressed This Visit    None       Follow up plan: Return if symptoms worsen or fail to improve, for As scheduled.

## 2018-02-12 ENCOUNTER — Ambulatory Visit: Payer: Medicare Other

## 2018-02-20 DIAGNOSIS — C44712 Basal cell carcinoma of skin of right lower limb, including hip: Secondary | ICD-10-CM | POA: Diagnosis not present

## 2018-03-19 ENCOUNTER — Ambulatory Visit: Payer: Medicare Other

## 2018-03-26 ENCOUNTER — Ambulatory Visit
Admission: RE | Admit: 2018-03-26 | Discharge: 2018-03-26 | Disposition: A | Discharge status: Home / Self Care | Payer: Medicare Other | Source: Ambulatory Visit | Attending: Orthopedic Surgery | Admitting: Orthopedic Surgery

## 2018-03-26 DIAGNOSIS — M67412 Ganglion, left shoulder: Secondary | ICD-10-CM | POA: Insufficient documentation

## 2018-03-26 DIAGNOSIS — X58XXXA Exposure to other specified factors, initial encounter: Secondary | ICD-10-CM | POA: Diagnosis not present

## 2018-03-26 DIAGNOSIS — S46112A Strain of muscle, fascia and tendon of long head of biceps, left arm, initial encounter: Secondary | ICD-10-CM | POA: Diagnosis not present

## 2018-03-26 DIAGNOSIS — M19012 Primary osteoarthritis, left shoulder: Secondary | ICD-10-CM | POA: Diagnosis not present

## 2018-03-26 DIAGNOSIS — M25512 Pain in left shoulder: Secondary | ICD-10-CM | POA: Diagnosis not present

## 2018-04-09 DIAGNOSIS — M19012 Primary osteoarthritis, left shoulder: Secondary | ICD-10-CM | POA: Diagnosis not present

## 2018-06-11 DIAGNOSIS — M7541 Impingement syndrome of right shoulder: Secondary | ICD-10-CM | POA: Diagnosis not present

## 2018-06-11 DIAGNOSIS — M25561 Pain in right knee: Secondary | ICD-10-CM | POA: Diagnosis not present

## 2018-06-30 DIAGNOSIS — Z01810 Encounter for preprocedural cardiovascular examination: Secondary | ICD-10-CM | POA: Diagnosis not present

## 2018-06-30 DIAGNOSIS — Z01811 Encounter for preprocedural respiratory examination: Secondary | ICD-10-CM | POA: Diagnosis not present

## 2018-06-30 DIAGNOSIS — Z01818 Encounter for other preprocedural examination: Secondary | ICD-10-CM | POA: Diagnosis not present

## 2018-06-30 DIAGNOSIS — M19012 Primary osteoarthritis, left shoulder: Secondary | ICD-10-CM | POA: Diagnosis not present

## 2018-06-30 DIAGNOSIS — Z01812 Encounter for preprocedural laboratory examination: Secondary | ICD-10-CM | POA: Diagnosis not present

## 2018-07-11 DIAGNOSIS — M19012 Primary osteoarthritis, left shoulder: Secondary | ICD-10-CM | POA: Diagnosis present

## 2018-07-11 DIAGNOSIS — M7522 Bicipital tendinitis, left shoulder: Secondary | ICD-10-CM | POA: Diagnosis present

## 2018-07-11 DIAGNOSIS — M25512 Pain in left shoulder: Secondary | ICD-10-CM | POA: Diagnosis not present

## 2018-07-11 DIAGNOSIS — K219 Gastro-esophageal reflux disease without esophagitis: Secondary | ICD-10-CM | POA: Diagnosis present

## 2018-07-11 DIAGNOSIS — I10 Essential (primary) hypertension: Secondary | ICD-10-CM | POA: Diagnosis not present

## 2018-07-11 DIAGNOSIS — G8918 Other acute postprocedural pain: Secondary | ICD-10-CM | POA: Diagnosis not present

## 2018-07-21 DIAGNOSIS — M19012 Primary osteoarthritis, left shoulder: Secondary | ICD-10-CM | POA: Diagnosis not present

## 2018-08-02 ENCOUNTER — Other Ambulatory Visit: Payer: Self-pay | Admitting: Family Medicine

## 2018-08-07 DIAGNOSIS — M25512 Pain in left shoulder: Secondary | ICD-10-CM | POA: Diagnosis not present

## 2018-08-07 DIAGNOSIS — M25612 Stiffness of left shoulder, not elsewhere classified: Secondary | ICD-10-CM | POA: Diagnosis not present

## 2018-08-13 DIAGNOSIS — M25512 Pain in left shoulder: Secondary | ICD-10-CM | POA: Diagnosis not present

## 2018-08-13 DIAGNOSIS — M25612 Stiffness of left shoulder, not elsewhere classified: Secondary | ICD-10-CM | POA: Diagnosis not present

## 2018-08-15 DIAGNOSIS — M25512 Pain in left shoulder: Secondary | ICD-10-CM | POA: Diagnosis not present

## 2018-08-15 DIAGNOSIS — M25612 Stiffness of left shoulder, not elsewhere classified: Secondary | ICD-10-CM | POA: Diagnosis not present

## 2018-08-19 DIAGNOSIS — M25512 Pain in left shoulder: Secondary | ICD-10-CM | POA: Diagnosis not present

## 2018-08-19 DIAGNOSIS — M25612 Stiffness of left shoulder, not elsewhere classified: Secondary | ICD-10-CM | POA: Diagnosis not present

## 2018-08-21 DIAGNOSIS — M25612 Stiffness of left shoulder, not elsewhere classified: Secondary | ICD-10-CM | POA: Diagnosis not present

## 2018-08-21 DIAGNOSIS — M25512 Pain in left shoulder: Secondary | ICD-10-CM | POA: Diagnosis not present

## 2018-08-26 DIAGNOSIS — M25612 Stiffness of left shoulder, not elsewhere classified: Secondary | ICD-10-CM | POA: Diagnosis not present

## 2018-08-26 DIAGNOSIS — M25512 Pain in left shoulder: Secondary | ICD-10-CM | POA: Diagnosis not present

## 2018-08-28 DIAGNOSIS — L570 Actinic keratosis: Secondary | ICD-10-CM | POA: Diagnosis not present

## 2018-08-28 DIAGNOSIS — M25612 Stiffness of left shoulder, not elsewhere classified: Secondary | ICD-10-CM | POA: Diagnosis not present

## 2018-08-28 DIAGNOSIS — X32XXXA Exposure to sunlight, initial encounter: Secondary | ICD-10-CM | POA: Diagnosis not present

## 2018-08-28 DIAGNOSIS — M25512 Pain in left shoulder: Secondary | ICD-10-CM | POA: Diagnosis not present

## 2018-08-28 DIAGNOSIS — Z08 Encounter for follow-up examination after completed treatment for malignant neoplasm: Secondary | ICD-10-CM | POA: Diagnosis not present

## 2018-08-28 DIAGNOSIS — Z85828 Personal history of other malignant neoplasm of skin: Secondary | ICD-10-CM | POA: Diagnosis not present

## 2018-08-28 DIAGNOSIS — L821 Other seborrheic keratosis: Secondary | ICD-10-CM | POA: Diagnosis not present

## 2018-08-28 DIAGNOSIS — Z872 Personal history of diseases of the skin and subcutaneous tissue: Secondary | ICD-10-CM | POA: Diagnosis not present

## 2018-09-02 DIAGNOSIS — M25612 Stiffness of left shoulder, not elsewhere classified: Secondary | ICD-10-CM | POA: Diagnosis not present

## 2018-09-02 DIAGNOSIS — M25512 Pain in left shoulder: Secondary | ICD-10-CM | POA: Diagnosis not present

## 2018-09-04 DIAGNOSIS — M25612 Stiffness of left shoulder, not elsewhere classified: Secondary | ICD-10-CM | POA: Diagnosis not present

## 2018-09-04 DIAGNOSIS — M25512 Pain in left shoulder: Secondary | ICD-10-CM | POA: Diagnosis not present

## 2018-09-08 DIAGNOSIS — Z96612 Presence of left artificial shoulder joint: Secondary | ICD-10-CM | POA: Diagnosis not present

## 2018-09-09 DIAGNOSIS — M25612 Stiffness of left shoulder, not elsewhere classified: Secondary | ICD-10-CM | POA: Diagnosis not present

## 2018-09-09 DIAGNOSIS — M25512 Pain in left shoulder: Secondary | ICD-10-CM | POA: Diagnosis not present

## 2018-09-11 DIAGNOSIS — M25612 Stiffness of left shoulder, not elsewhere classified: Secondary | ICD-10-CM | POA: Diagnosis not present

## 2018-09-11 DIAGNOSIS — M25512 Pain in left shoulder: Secondary | ICD-10-CM | POA: Diagnosis not present

## 2018-09-16 DIAGNOSIS — M25612 Stiffness of left shoulder, not elsewhere classified: Secondary | ICD-10-CM | POA: Diagnosis not present

## 2018-09-16 DIAGNOSIS — M25512 Pain in left shoulder: Secondary | ICD-10-CM | POA: Diagnosis not present

## 2018-09-18 DIAGNOSIS — M25612 Stiffness of left shoulder, not elsewhere classified: Secondary | ICD-10-CM | POA: Diagnosis not present

## 2018-09-18 DIAGNOSIS — M25512 Pain in left shoulder: Secondary | ICD-10-CM | POA: Diagnosis not present

## 2018-09-25 DIAGNOSIS — M25512 Pain in left shoulder: Secondary | ICD-10-CM | POA: Diagnosis not present

## 2018-09-25 DIAGNOSIS — M25612 Stiffness of left shoulder, not elsewhere classified: Secondary | ICD-10-CM | POA: Diagnosis not present

## 2018-09-30 DIAGNOSIS — M25512 Pain in left shoulder: Secondary | ICD-10-CM | POA: Diagnosis not present

## 2018-09-30 DIAGNOSIS — M25612 Stiffness of left shoulder, not elsewhere classified: Secondary | ICD-10-CM | POA: Diagnosis not present

## 2018-10-07 DIAGNOSIS — M7541 Impingement syndrome of right shoulder: Secondary | ICD-10-CM | POA: Diagnosis not present

## 2018-10-09 DIAGNOSIS — M25512 Pain in left shoulder: Secondary | ICD-10-CM | POA: Diagnosis not present

## 2018-10-09 DIAGNOSIS — M25612 Stiffness of left shoulder, not elsewhere classified: Secondary | ICD-10-CM | POA: Diagnosis not present

## 2018-10-14 DIAGNOSIS — M25512 Pain in left shoulder: Secondary | ICD-10-CM | POA: Diagnosis not present

## 2018-10-14 DIAGNOSIS — M25612 Stiffness of left shoulder, not elsewhere classified: Secondary | ICD-10-CM | POA: Diagnosis not present

## 2018-10-21 DIAGNOSIS — M25512 Pain in left shoulder: Secondary | ICD-10-CM | POA: Diagnosis not present

## 2018-10-21 DIAGNOSIS — M25612 Stiffness of left shoulder, not elsewhere classified: Secondary | ICD-10-CM | POA: Diagnosis not present

## 2018-11-13 ENCOUNTER — Ambulatory Visit: Payer: Medicare Other | Admitting: Family Medicine

## 2018-11-24 ENCOUNTER — Other Ambulatory Visit: Payer: Self-pay | Admitting: Family Medicine

## 2018-12-15 ENCOUNTER — Other Ambulatory Visit: Payer: Self-pay | Admitting: Family Medicine

## 2018-12-15 NOTE — Telephone Encounter (Signed)
Requested Prescriptions  Pending Prescriptions Disp Refills  . sildenafil (REVATIO) 20 MG tablet [Pharmacy Med Name: SILDENAFIL CITRATE 20 MG TAB] 50 tablet 0    Sig: TAKE ONE TABLET BY MOUTH EVERY DAY AS NEEDED     Urology: Erectile Dysfunction Agents Passed - 12/15/2018  2:59 PM      Passed - Last BP in normal range    BP Readings from Last 1 Encounters:  02/04/18 139/88         Passed - Valid encounter within last 12 months    Recent Outpatient Visits          10 months ago Essential hypertension   [facility] [place], Jeannette How, MD   11 months ago Essential hypertension   [facility] [place], Jeannette How, MD   1 year ago Essential hypertension   [facility], Jeannette How, MD   1 year ago PE (physical exam), annual   [facility], Jeannette How, MD   1 year ago Acute sinusitis, recurrence not specified, unspecified location   [facility] [place], Jeannette How, MD      Future Appointments            In 1 month [place], [place]

## 2019-01-19 ENCOUNTER — Other Ambulatory Visit: Payer: Self-pay | Admitting: Family Medicine

## 2019-01-22 ENCOUNTER — Ambulatory Visit (INDEPENDENT_AMBULATORY_CARE_PROVIDER_SITE_OTHER): Payer: Medicare Other

## 2019-01-22 VITALS — BP 127/84 | HR 67 | Temp 96.8°F | Ht 70.0 in | Wt 217.6 lb

## 2019-01-22 DIAGNOSIS — Z0000 Encounter for general adult medical examination without abnormal findings: Secondary | ICD-10-CM | POA: Diagnosis not present

## 2019-01-22 NOTE — Patient Instructions (Signed)
Tyler Sharp , Thank you for taking time to come for your Medicare Wellness Visit. I appreciate your ongoing commitment to your health goals. Please review the following plan we discussed and let me know if I can assist you in the future.   Screening recommendations/referrals: Colonoscopy: completed 11/30/2011 Recommended yearly ophthalmology/optometry visit for glaucoma screening and checkup Recommended yearly dental visit for hygiene and checkup  Vaccinations: Influenza vaccine: due 04/2019 Pneumococcal vaccine: declined Tdap vaccine: due, check with your insurance company for coverage  Shingles vaccine: shingrix eligible, check with your insurance company for coverage   Advanced directives: Please bring a copy of your health care power of attorney and living will to the office at your convenience.  Conditions/risks identified: none  Next appointment: follow up in one year for your annual wellness exam.   Preventive Care 70 Years and Older, Male Preventive care refers to lifestyle choices and visits with your health care provider that can promote health and wellness. What does preventive care include?  A yearly physical exam. This is also called an annual well check.  Dental exams once or twice a year.  Routine eye exams. Ask your health care provider how often you should have your eyes checked.  Personal lifestyle choices, including:  Daily care of your teeth and gums.  Regular physical activity.  Eating a healthy diet.  Avoiding tobacco and drug use.  Limiting alcohol use.  Practicing safe sex.  Taking low doses of aspirin every day.  Taking vitamin and mineral supplements as recommended by your health care provider. What happens during an annual well check? The services and screenings done by your health care provider during your annual well check will depend on your age, overall health, lifestyle risk factors, and family history of disease. Counseling  Your  health care provider may ask you questions about your:  Alcohol use.  Tobacco use.  Drug use.  Emotional well-being.  Home and relationship well-being.  Sexual activity.  Eating habits.  History of falls.  Memory and ability to understand (cognition).  Work and work Statistician. Screening  You may have the following tests or measurements:  Height, weight, and BMI.  Blood pressure.  Lipid and cholesterol levels. These may be checked every 5 years, or more frequently if you are over 39 years old.  Skin check.  Lung cancer screening. You may have this screening every year starting at age 70 if you have a 30-pack-year history of smoking and currently smoke or have quit within the past 15 years.  Fecal occult blood test (FOBT) of the stool. You may have this test every year starting at age 70.  Flexible sigmoidoscopy or colonoscopy. You may have a sigmoidoscopy every 5 years or a colonoscopy every 10 years starting at age 70.  Prostate cancer screening. Recommendations will vary depending on your family history and other risks.  Hepatitis C blood test.  Hepatitis B blood test.  Sexually transmitted disease (STD) testing.  Diabetes screening. This is done by checking your blood sugar (glucose) after you have not eaten for a while (fasting). You may have this done every 1-3 years.  Abdominal aortic aneurysm (AAA) screening. You may need this if you are a current or former smoker.  Osteoporosis. You may be screened starting at age 70 if you are at high risk. Talk with your health care provider about your test results, treatment options, and if necessary, the need for more tests. Vaccines  Your health care provider may recommend certain vaccines,  such as:  Influenza vaccine. This is recommended every year.  Tetanus, diphtheria, and acellular pertussis (Tdap, Td) vaccine. You may need a Td booster every 10 years.  Zoster vaccine. You may need this after age 70.   Pneumococcal 13-valent conjugate (PCV13) vaccine. One dose is recommended after age 70.  Pneumococcal polysaccharide (PPSV23) vaccine. One dose is recommended after age 70. Talk to your health care provider about which screenings and vaccines you need and how often you need them. This information is not intended to replace advice given to you by your health care provider. Make sure you discuss any questions you have with your health care provider. Document Released: 08/19/2015 Document Revised: 04/11/2016 Document Reviewed: 05/24/2015 Elsevier Interactive Patient Education  2017 [place] Prevention in the Home Falls can cause injuries. They can happen to people of all ages. There are many things you can do to make your home safe and to help prevent falls. What can I do on the outside of my home?  Regularly fix the edges of walkways and driveways and fix any cracks.  Remove anything that might make you trip as you walk through a door, such as a raised step or threshold.  Trim any bushes or trees on the path to your home.  Use bright outdoor lighting.  Clear any walking paths of anything that might make someone trip, such as rocks or tools.  Regularly check to see if handrails are loose or broken. Make sure that both sides of any steps have handrails.  Any raised decks and porches should have guardrails on the edges.  Have any leaves, snow, or ice cleared regularly.  Use sand or salt on walking paths during winter.  Clean up any spills in your garage right away. This includes oil or grease spills. What can I do in the bathroom?  Use night lights.  Install grab bars by the toilet and in the tub and shower. Do not use towel bars as grab bars.  Use non-skid mats or decals in the tub or shower.  If you need to sit down in the shower, use a plastic, non-slip stool.  Keep the floor dry. Clean up any water that spills on the floor as soon as it happens.  Remove soap  buildup in the tub or shower regularly.  Attach bath mats securely with double-sided non-slip rug tape.  Do not have throw rugs and other things on the floor that can make you trip. What can I do in the bedroom?  Use night lights.  Make sure that you have a light by your bed that is easy to reach.  Do not use any sheets or blankets that are too big for your bed. They should not hang down onto the floor.  Have a firm chair that has side arms. You can use this for support while you get dressed.  Do not have throw rugs and other things on the floor that can make you trip. What can I do in the kitchen?  Clean up any spills right away.  Avoid walking on wet floors.  Keep items that you use a lot in easy-to-reach places.  If you need to reach something above you, use a strong step stool that has a grab bar.  Keep electrical cords out of the way.  Do not use floor polish or wax that makes floors slippery. If you must use wax, use non-skid floor wax.  Do not have throw rugs and other things on  the floor that can make you trip. What can I do with my stairs?  Do not leave any items on the stairs.  Make sure that there are handrails on both sides of the stairs and use them. Fix handrails that are broken or loose. Make sure that handrails are as long as the stairways.  Check any carpeting to make sure that it is firmly attached to the stairs. Fix any carpet that is loose or worn.  Avoid having throw rugs at the top or bottom of the stairs. If you do have throw rugs, attach them to the floor with carpet tape.  Make sure that you have a light switch at the top of the stairs and the bottom of the stairs. If you do not have them, ask someone to add them for you. What else can I do to help prevent falls?  Wear shoes that:  Do not have high heels.  Have rubber bottoms.  Are comfortable and fit you well.  Are closed at the toe. Do not wear sandals.  If you use a stepladder:  Make  sure that it is fully opened. Do not climb a closed stepladder.  Make sure that both sides of the stepladder are locked into place.  Ask someone to hold it for you, if possible.  Clearly mark and make sure that you can see:  Any grab bars or handrails.  First and last steps.  Where the edge of each step is.  Use tools that help you move around (mobility aids) if they are needed. These include:  Canes.  Walkers.  Scooters.  Crutches.  Turn on the lights when you go into a dark area. Replace any light bulbs as soon as they burn out.  Set up your furniture so you have a clear path. Avoid moving your furniture around.  If any of your floors are uneven, fix them.  If there are any pets around you, be aware of where they are.  Review your medicines with your doctor. Some medicines can make you feel dizzy. This can increase your chance of falling. Ask your doctor what other things that you can do to help prevent falls. This information is not intended to replace advice given to you by your health care provider. Make sure you discuss any questions you have with your health care provider. Document Released: 05/19/2009 Document Revised: 12/29/2015 Document Reviewed: 08/27/2014 Elsevier Interactive Patient Education  2017 [employer].

## 2019-01-22 NOTE — Progress Notes (Signed)
Subjective:   Tyler Sharp. is a 68 y.o. male who presents for Medicare Annual/Subsequent preventive examination.  This visit is being conducted via phone call  - after an attmept to do on video chat - due to the COVID-19 pandemic. This patient has given me verbal consent via phone to conduct this visit, patient states they are participating from their home address. Some vital signs may be absent or patient reported.   Patient identification: identified by name, DOB, and current address.     Review of Systems:   Cardiac Risk Factors include: advanced age (>57men, >67 women);hypertension;obesity (BMI >30kg/m2);male gender     Objective:    Vitals: BP 127/84 Comment: patient reported  Pulse 67 Comment: patient reported  Temp (!) 96.8 F (36 C) Comment: patient reported  Ht 5\' 10"  (1.778 m) Comment: patient reported  Wt 217 lb 9.6 oz (98.7 kg) Comment: patient reported  BMI 31.22 kg/m   Body mass index is 31.22 kg/m.  Advanced Directives 01/22/2019 01/15/2018 01/10/2017  Does Patient Have a Medical Advance Directive? Yes Yes Yes  Type of Advance Directive Living will;Healthcare Power of [place];Living will [place];Living will  Copy of [place] in Chart? No - copy requested No - copy requested No - copy requested    Tobacco Social History   Tobacco Use  Smoking Status Never Smoker  Smokeless Tobacco Never Used     Counseling given: Not Answered   Clinical Intake:  Pre-visit preparation completed: Yes  Pain : No/denies pain     Nutritional Status: BMI > 30  Obese Nutritional Risks: None Diabetes: No  How often do you need to have someone help you when you read instructions, pamphlets, or other written materials from your doctor or pharmacy?: 1 - Never What is the last grade level you completed in school?: bachelors  Interpreter Needed?: No  Information entered by ::  ,LPN   Past Medical History:  Diagnosis Date  . Hypertension    Past Surgical History:  Procedure Laterality Date  . APPENDECTOMY    . CHOLECYSTECTOMY    . KNEE SURGERY Right   . SHOULDER SURGERY Left    Family History  Problem Relation Age of Onset  . Heart disease Mother   . Heart attack Mother   . Brain cancer Father   . Diabetes Sister    Social History   Socioeconomic History  . Marital status: Married    Spouse name: Not on file  . Number of children: Not on file  . Years of education: Not on file  . Highest education level: Bachelor's degree (e.g., BA, AB, BS)  Occupational History  . Occupation: retired  Scientific laboratory technician  . Financial resource strain: Not hard at all  . Food insecurity    Worry: Never true    Inability: Never true  . Transportation needs    Medical: No    Non-medical: No  Tobacco Use  . Smoking status: Never Smoker  . Smokeless tobacco: Never Used  Substance and Sexual Activity  . Alcohol use: Yes    Alcohol/week: 2.0 standard drinks    Types: 2 Shots of liquor per week    Comment: occasional  . Drug use: No  . Sexual activity: Not on file  Lifestyle  . Physical activity    Days per week: 7 days    Minutes per session: 30 min  . Stress: Not at all  Relationships  . Social  connections    Talks on phone: More than three times a week    Gets together: More than three times a week    Attends religious service: More than 4 times per year    Active member of club or organization: Yes    Attends meetings of clubs or organizations: More than 4 times per year    Relationship status: Married  Other Topics Concern  . Not on file  Social History Narrative  . Not on file    Outpatient Encounter Medications as of 01/22/2019  Medication Sig  . amLODipine (NORVASC) 5 MG tablet TAKE 1 TABLET EVERY DAY (NEED MD APPOINTMENT)  . ibuprofen (ADVIL) 800 MG tablet ibuprofen 800 mg tablet  TAKE 1 TABLET THREE TIMES A DAY  . lisinopril (PRINIVIL,ZESTRIL) 20 MG  tablet TAKE 1 TABLET EVERY DAY  . sildenafil (REVATIO) 20 MG tablet TAKE ONE TABLET BY MOUTH EVERY DAY AS NEEDED  . UNABLE TO FIND Med Name: prostagenix   No facility-administered encounter medications on file as of 01/22/2019.     Activities of Daily Living In your present state of health, do you have any difficulty performing the following activities: 01/22/2019  Hearing? N  Vision? N  Difficulty concentrating or making decisions? N  Walking or climbing stairs? N  Dressing or bathing? N  Doing errands, shopping? N  Preparing Food and eating ? N  Using the Toilet? N  In the past six months, have you accidently leaked urine? N  Comment taking prostagenix  Do you have problems with loss of bowel control? N  Managing your Medications? N  Managing your Finances? N  Housekeeping or managing your Housekeeping? N  Some recent data might be hidden    Patient Care Team: Guadalupe Maple, MD as PCP - General (Family Medicine) Thornton Park, MD as Referring Physician (Orthopedic Surgery) Manya Silvas, MD (Gastroenterology)   Assessment:   This is a routine wellness examination for Tyler Sharp.  Exercise Activities and Dietary recommendations Current Exercise Habits: The patient does not participate in regular exercise at present;Home exercise routine, Type of exercise: walking, Time (Minutes): 30, Frequency (Times/Week): 7, Weekly Exercise (Minutes/Week): 210, Intensity: Mild, Exercise limited by: None identified  Goals    . DIET - INCREASE WATER INTAKE     Recommend drinking at least 6-8 glasses of water a day        Fall Risk: Fall Risk  01/22/2019 01/15/2018 11/14/2017 11/14/2017 02/25/2017  Falls in the past year? 0 No No No Yes  Comment - - - - Emmi Telephone Survey: data to providers prior to load  Number falls in past yr: - - - - 1  Comment - - - - Emmi Telephone Survey Actual Response = 1  Injury with Fall? - - - - No    FALL RISK PREVENTION PERTAINING TO THE HOME:   Any stairs in or around the home? Yes  If so, are there any without handrails? No   Home free of loose throw rugs in walkways, pet beds, electrical cords, etc? Yes  Adequate lighting in your home to reduce risk of falls? Yes   ASSISTIVE DEVICES UTILIZED TO PREVENT FALLS:  Life alert? No  Use of a cane, walker or w/c? No  Grab bars in the bathroom? No  Shower chair or bench in shower? No  Elevated toilet seat or a handicapped toilet? No   TIMED UP AND GO:  Unable to perform   Depression Screen PHQ 2/9 Scores 01/22/2019  01/15/2018 11/14/2017 11/14/2017  PHQ - 2 Score 0 0 0 0  PHQ- 9 Score - - - -    Cognitive Function     6CIT Screen 01/22/2019 01/15/2018 01/10/2017  What Year? 0 points 0 points 0 points  What month? 0 points 0 points 0 points  What time? 0 points 0 points 0 points  Count back from 20 0 points 0 points 0 points  Months in reverse 0 points 0 points 0 points  Repeat phrase 0 points 0 points 0 points  Total Score 0 0 0    Immunization History  Administered Date(s) Administered  . Influenza-Unspecified 06/25/2016, 04/23/2017  . Td 11/25/2006    Qualifies for Shingles Vaccine? Yes  Zostavax completed n/a. Due for Shingrix. Education has been provided regarding the importance of this vaccine. Pt has been advised to call insurance company to determine out of pocket expense. Advised may also receive vaccine at local pharmacy or Health Dept. Verbalized acceptance and understanding.  Tdap: Education has been provided regarding the importance of this vaccine. Advised may receive this vaccine at local pharmacy or Health Dept. Aware to provide a copy of the vaccination record if obtained from local pharmacy or Health Dept. Verbalized acceptance and understanding.  Flu Vaccine: up to declined  Pneumococcal Vaccine: declined  Screening Tests Health Maintenance  Topic Date Due  . PNA vac Low Risk Adult (1 of 2 - PCV13) 07/24/2014  . TETANUS/TDAP  11/24/2016  .  INFLUENZA VACCINE  03/07/2019  . COLONOSCOPY  11/29/2021  . Hepatitis C Screening  Completed   Cancer Screenings:  Colorectal Screening: Completed 11/30/2011. Repeat every 10 year  Lung Cancer Screening: (Low Dose CT Chest recommended if Age 34-80 years, 30 pack-year currently smoking OR have quit w/in 15years.) does not qualify.   Additional Screening:  Hepatitis C Screening: does qualify; Completed 01/26/2016  Vision Screening: Recommended annual ophthalmology exams for early detection of glaucoma and other disorders of the eye. Is the patient up to date with their annual eye exam?  No     Dental Screening: Recommended annual dental exams for proper oral hygiene  Community Resource Referral:  CRR required this visit?  No        Plan:  I have personally reviewed and addressed the Medicare Annual Wellness questionnaire and have noted the following in the patient's chart:  A. Medical and social history B. Use of alcohol, tobacco or illicit drugs  C. Current medications and supplements D. Functional ability and status E.  Nutritional status F.  Physical activity G. Advance directives H. List of other physicians I.  Hospitalizations, surgeries, and ER visits in previous 12 months J.  [place] such as hearing and vision if needed, cognitive and depression L. Referrals and appointments   In addition, I have reviewed and discussed with patient certain preventive protocols, quality metrics, and best practice recommendations. A written personalized care plan for preventive services as well as general preventive health recommendations were provided to patient.   Signed,   Bevelyn Ngo, LPN  10/27/5571 Nurse Health Advisor   Nurse Notes: patient requesting valtrex refill for his recurrent fever blisters.

## 2019-02-21 ENCOUNTER — Other Ambulatory Visit: Payer: Self-pay | Admitting: Family Medicine

## 2019-05-15 IMAGING — MR MR CERVICAL SPINE W/O CM
5 series · 33 of 48 positions shown · non-contrast
Comparison: None.

CLINICAL DATA: Cervical radiculopathy of the left arm

EXAM:
MRI CERVICAL SPINE WITHOUT CONTRAST
TECHNIQUE: Multiplanar, multisequence MR imaging of the cervical spine was
performed. No intravenous contrast was administered.

[Series 3: T2 · sagittal · 3.0mm · 0.70mm/px · 6 of 13 slices shown (1 of 2)]
[im 1/13]
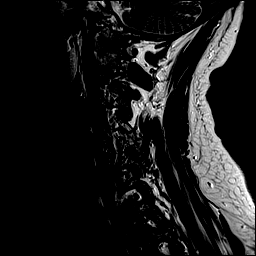
[im 3/13]
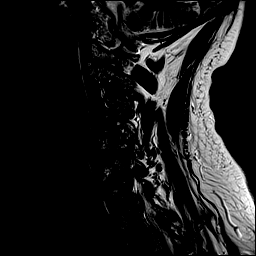
[im 5/13]
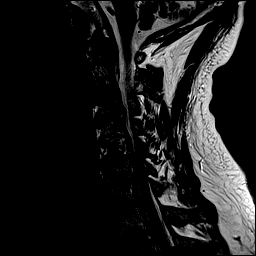
[im 8/13]
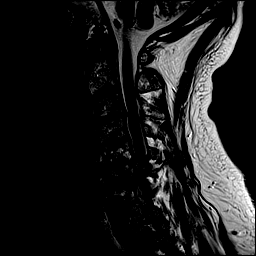
[im 10/13]
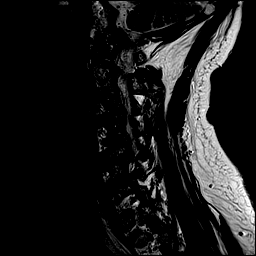
[im 13/13]
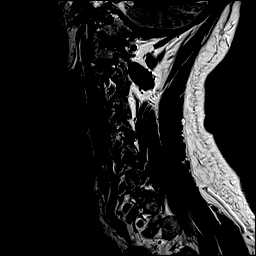

[Series 4: T1 · sagittal · 3.0mm · 0.70mm/px · 7 of 13 slices shown]
[im 1/13]
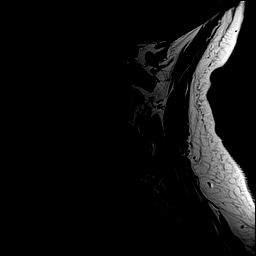
[im 3/13]
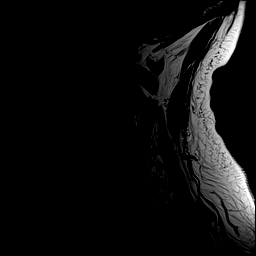
[im 5/13]
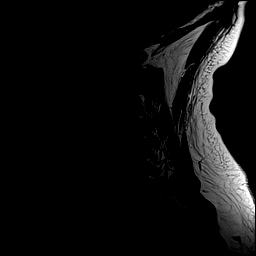
[im 7/13]
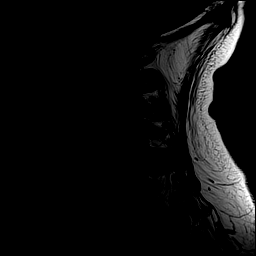
[im 9/13]
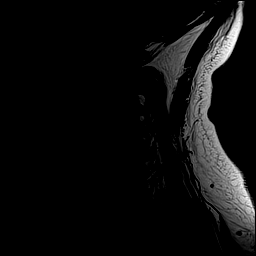
[im 11/13]
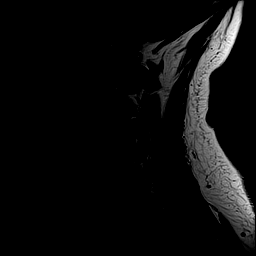
[im 13/13]
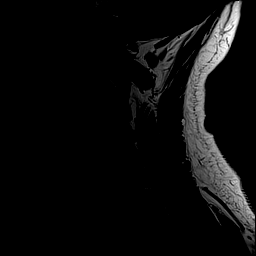

[Series 5: STIR · sagittal · 3.0mm · 0.35mm/px · 7 of 13 slices shown]
[im 1/13]
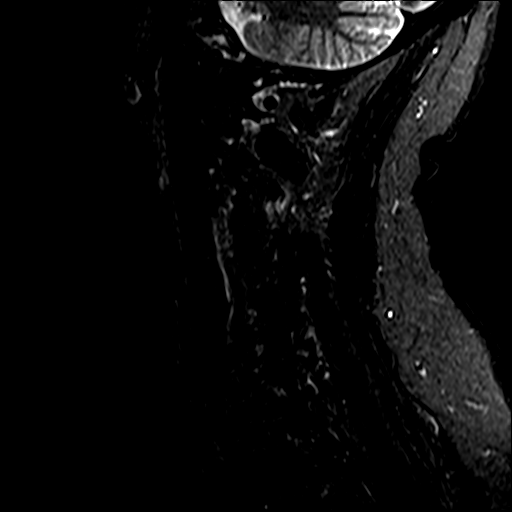
[im 3/13]
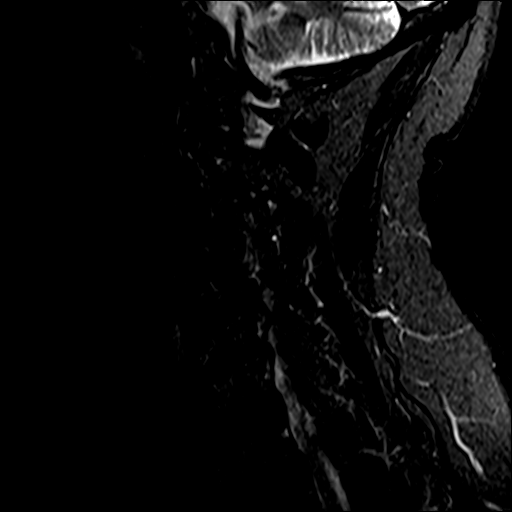
[im 5/13]
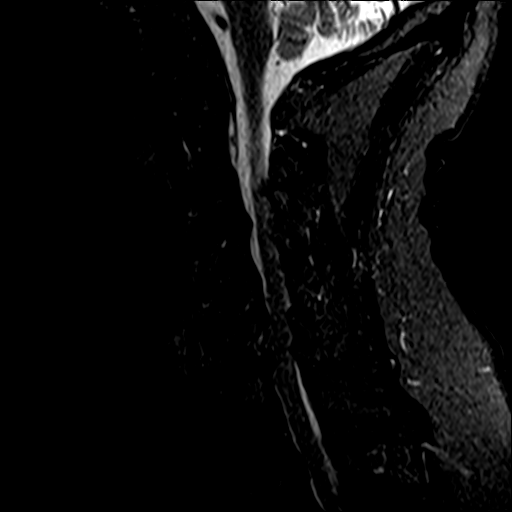
[im 7/13]
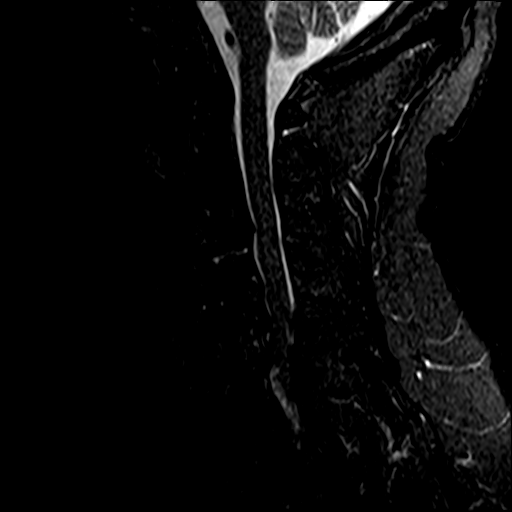
[im 9/13]
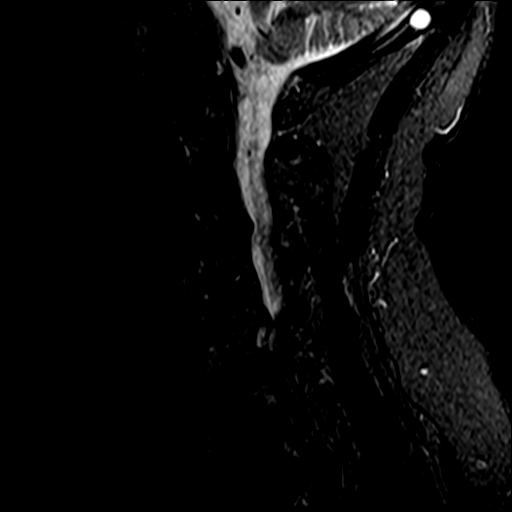
[im 11/13]
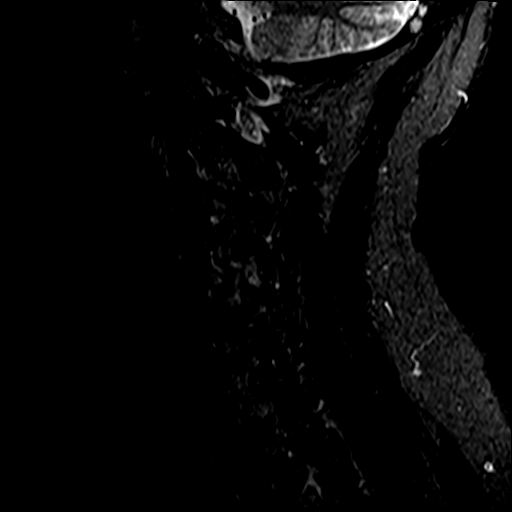
[im 13/13]
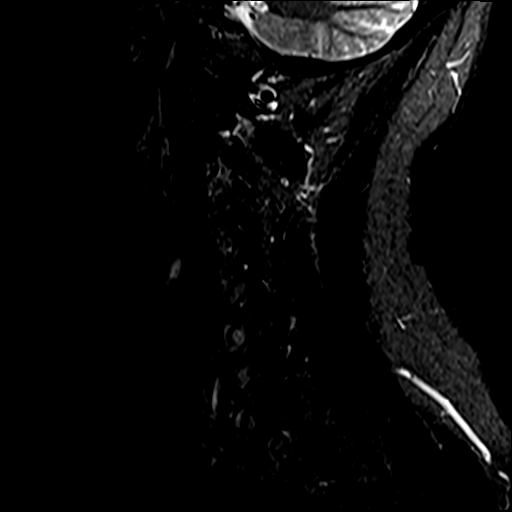

[Series 6: T2 · axial · 3.0mm · 0.70mm/px · z∈[-69,+32]mm · 8 of 28 slices shown (2 of 2)]
[im 1/28]
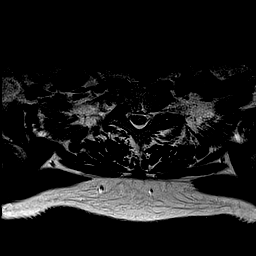
[im 5/28]
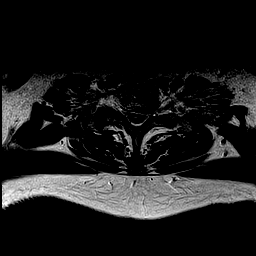
[im 9/28]
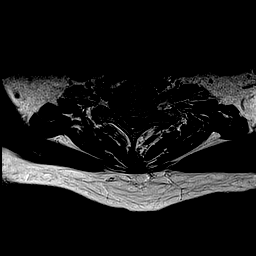
[im 13/28]
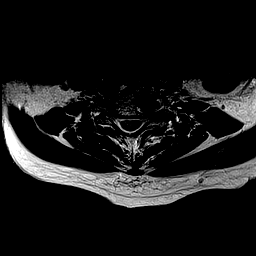
[im 15/28]
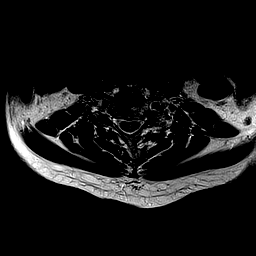
[im 19/28]
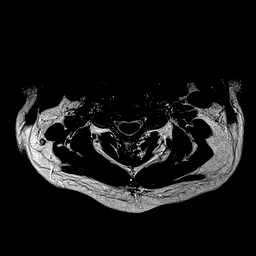
[im 23/28]
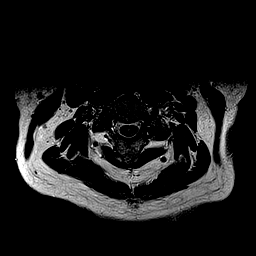
[im 28/28]
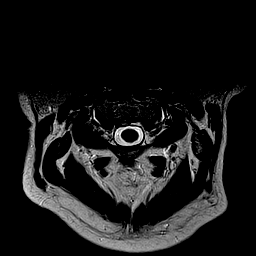

[Series 7: mpgr ax · axial · 3.0mm · 0.35mm/px · z∈[-69,-17]mm · 5 of 28 slices shown]
[im 1/28]
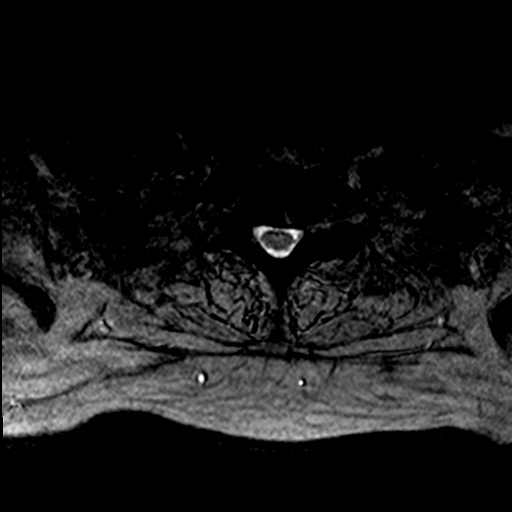
[im 5/28]
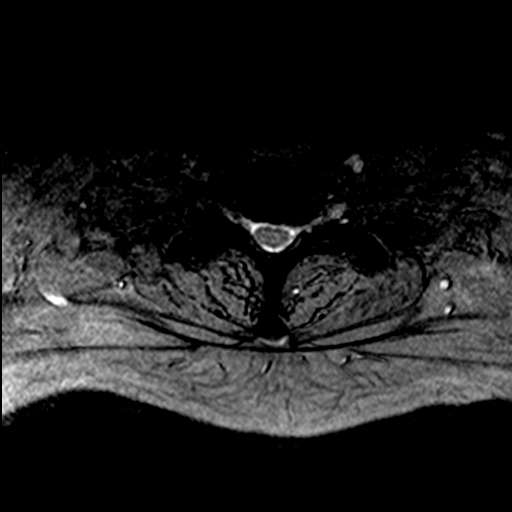
[im 9/28]
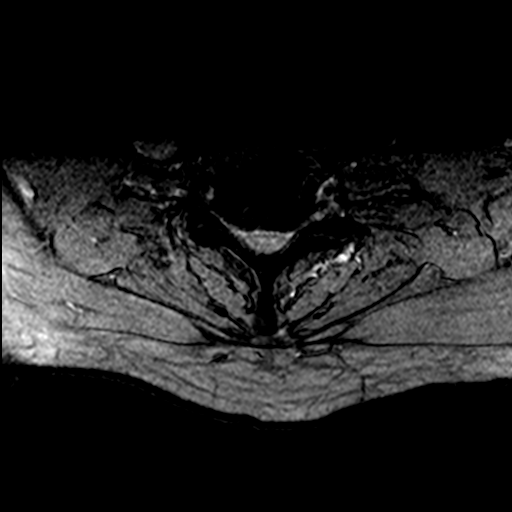
[im 13/28]
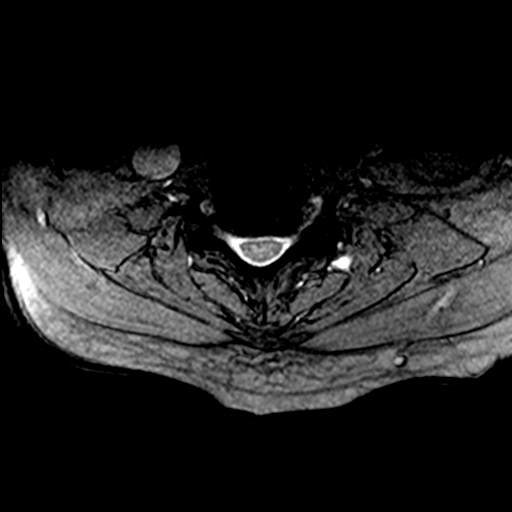
[im 15/28]
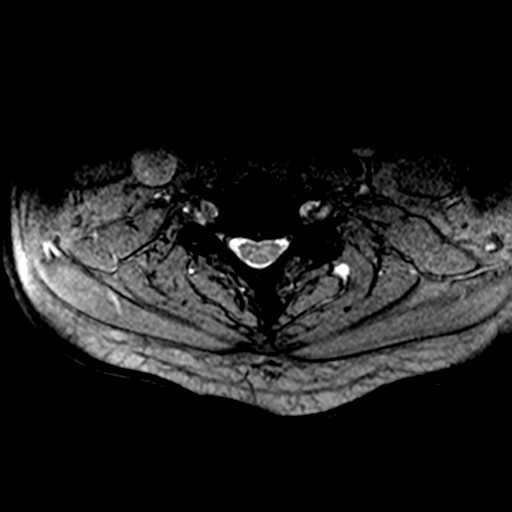

[33 of 48 positions shown; findings below may reference images not displayed]

FINDINGS: Alignment: There is grade 1 retrolisthesis at C5-C6.

Vertebrae: No fracture, evidence of discitis, or bone lesion.

Cord: Normal caliber and signal

Posterior Fossa, vertebral arteries, paraspinal tissues: Visualized
posterior fossa is normal. Vertebral artery flow voids are
preserved. Normal visualized paraspinal soft tissues.

Disc levels:

C1-C2: Normal.

C2-C3: Left facet hypertrophy with mild left foraminal narrowing. No
spinal canal stenosis.

C3-C4: Small disc osteophyte complex with mild bilateral foraminal
narrowing. No spinal canal stenosis.

C4-C5: Severe disc space narrowing and left facet hypertrophy. Mild
left foraminal narrowing. No spinal canal stenosis.

C5-C6: Severe disc space narrowing and small central disc protrusion
effacing the ventral thecal sac. Moderate spinal canal stenosis.
Mild right and moderate left neural foraminal stenosis.

C6-C7: Severe disc space narrowing with small disc osteophyte
complex causing moderate spinal canal stenosis. Severe bilateral
neural foraminal stenosis.

C7-T1: Left eccentric disc bulge.  No stenosis.
IMPRESSION: 1. Moderate spinal canal and severe bilateral neural foraminal
stenosis at C6-C7.
2. Moderate spinal canal and left neural foraminal stenosis at
C5-C6.

## 2019-06-09 DIAGNOSIS — K429 Umbilical hernia without obstruction or gangrene: Secondary | ICD-10-CM | POA: Diagnosis not present

## 2019-06-10 ENCOUNTER — Other Ambulatory Visit: Payer: Self-pay | Admitting: General Surgery

## 2019-06-11 ENCOUNTER — Other Ambulatory Visit: Payer: Self-pay | Admitting: General Surgery

## 2019-06-30 ENCOUNTER — Ambulatory Visit (INDEPENDENT_AMBULATORY_CARE_PROVIDER_SITE_OTHER): Payer: Medicare Other | Admitting: Family Medicine

## 2019-06-30 ENCOUNTER — Telehealth: Payer: Self-pay | Admitting: Family Medicine

## 2019-06-30 ENCOUNTER — Other Ambulatory Visit: Payer: Self-pay

## 2019-06-30 ENCOUNTER — Encounter: Payer: Self-pay | Admitting: Family Medicine

## 2019-06-30 VITALS — BP 138/81 | HR 62 | Temp 97.3°F | Wt 210.0 lb

## 2019-06-30 DIAGNOSIS — I10 Essential (primary) hypertension: Secondary | ICD-10-CM | POA: Diagnosis not present

## 2019-06-30 DIAGNOSIS — M19019 Primary osteoarthritis, unspecified shoulder: Secondary | ICD-10-CM | POA: Diagnosis not present

## 2019-06-30 DIAGNOSIS — N529 Male erectile dysfunction, unspecified: Secondary | ICD-10-CM | POA: Diagnosis not present

## 2019-06-30 MED ORDER — LISINOPRIL 20 MG PO TABS: 20.0000 mg | ORAL_TABLET | Freq: Every day | ORAL | 1 refills | Status: DC

## 2019-06-30 MED ORDER — SILDENAFIL CITRATE 20 MG PO TABS: 20.0000 mg | ORAL_TABLET | Freq: Every day | ORAL | 5 refills | Status: AC | PRN

## 2019-06-30 MED ORDER — AMLODIPINE BESYLATE 5 MG PO TABS: 5.0000 mg | ORAL_TABLET | Freq: Every day | ORAL | 0 refills | Status: DC

## 2019-06-30 MED ORDER — AMLODIPINE BESYLATE 5 MG PO TABS: 5.0000 mg | ORAL_TABLET | Freq: Every day | ORAL | 1 refills | Status: DC

## 2019-06-30 NOTE — Assessment & Plan Note (Signed)
Stable and under good control with prn sildenafil, continue current regimen

## 2019-06-30 NOTE — Chronic Care Management (AMB) (Signed)
°  Chronic Care Management   Outreach Note  06/30/2019 Name: Tyler Sharp. MRN: [id number] DOB: [birth year]  Referred by: Guadalupe Maple, MD Reason for referral : Chronic Care Management (Initial CCM outreach was unsuccessful. )   An unsuccessful telephone outreach was attempted today. The patient was referred to the case management team by for assistance with care management and care coordination.   Follow Up Plan: A HIPPA compliant phone message was left for the patient providing contact information and requesting a return call.  The care management team will reach out to the patient again over the next 7 days.  If patient returns call to provider office, please advise to call [place] at [facility]  [place], [place] [address] Direct Dial: [email or web address]  Website: [email or web address]

## 2019-06-30 NOTE — Progress Notes (Signed)
BP 138/81   Pulse 62   Temp (!) 97.3 F (36.3 C) (Oral)   Wt 210 lb (95.3 kg)   BMI 30.13 kg/m    Subjective:    Patient ID: Tyler Kohler., male    DOB: 20-Nov-1950, 68 y.o.   MRN: [id number]  HPI: Tyler Tiet. is a 68 y.o. male  Chief Complaint  Patient presents with  . Hypertension    amlodipine and sildenafil refill    . This visit was completed via WebEx due to the restrictions of the COVID-19 pandemic. All issues as above were discussed and addressed. Physical exam was done as above through visual confirmation on WebEx. If it was felt that the patient should be evaluated in the office, they were directed there. The patient verbally consented to this visit. . Location of the patient: home . Location of the provider: work . Those involved with this call:  . Provider: Merrie Roof, PA-C . CMA: Lesle Chris, [place] . Front Desk/Registration: Jill Side  . Time spent on call: 15 minutes with patient face to face via video conference. More than 50% of this time was spent in counseling and coordination of care. 5 minutes total spent in review of patient's record and preparation of their chart. I verified patient identity using two factors (patient name and date of birth). Patient consents verbally to being seen via telemedicine visit today.   Patient presenting today for 6 month f/u chronic conditions.   HTN - taking amlodipine and lisinopril which seems to be controlling his BPs well. Getting typically 120s-130s/80s at home. Denies side effects, Cp, SOB, HAs, dizziness. Trying to stay active and eat right.   Diclofenac gel prn for his arthritis seems to be going well. Able to remain very active.   ED - taking prn sildenafil which has been working well for him without side effects.   Relevant past medical, surgical, family and social history reviewed and updated as indicated. Interim medical history since our last visit reviewed. Allergies and medications  reviewed and updated.  Review of Systems  Per HPI unless specifically indicated above     Objective:    BP 138/81   Pulse 62   Temp (!) 97.3 F (36.3 C) (Oral)   Wt 210 lb (95.3 kg)   BMI 30.13 kg/m   Wt Readings from Last 3 Encounters:  06/30/19 210 lb (95.3 kg)  01/22/19 217 lb 9.6 oz (98.7 kg)  02/04/18 210 lb (95.3 kg)    Physical Exam Vitals signs and nursing note reviewed.  Constitutional:      General: He is not in acute distress.    Appearance: Normal appearance.  HENT:     Head: Atraumatic.     Right Ear: External ear normal.     Left Ear: External ear normal.     Nose: Nose normal. No congestion.     Mouth/Throat:     Mouth: Mucous membranes are moist.     Pharynx: Oropharynx is clear.  Eyes:     Extraocular Movements: Extraocular movements intact.     Conjunctiva/sclera: Conjunctivae normal.  Neck:     Musculoskeletal: Normal range of motion.  Pulmonary:     Effort: Pulmonary effort is normal. No respiratory distress.  Musculoskeletal: Normal range of motion.  Skin:    General: Skin is dry.     Findings: No erythema or rash.  Neurological:     Mental Status: He is oriented to person, place, and time.  Psychiatric:  Mood and Affect: Mood normal.        Thought Content: Thought content normal.        Judgment: Judgment normal.     Results for orders placed or performed in visit on [id number]  Basic metabolic panel  Result Value Ref Range   Glucose 93 65 - 99 mg/dL   BUN 13 8 - 27 mg/dL   Creatinine, Ser 0.92 0.76 - 1.27 mg/dL   GFR calc non Af Amer 85 >59 mL/min/1.73   GFR calc Af Amer 98 >59 mL/min/1.73   BUN/Creatinine Ratio 14 10 - 24   Sodium 139 134 - 144 mmol/L   Potassium 5.1 3.5 - 5.2 mmol/L   Chloride 102 96 - 106 mmol/L   CO2 25 20 - 29 mmol/L   Calcium 9.6 8.6 - 10.2 mg/dL      Assessment & Plan:   Problem List Items Addressed This Visit      Cardiovascular and Mediastinum   Hypertension - Primary    BPs stable and  WNL, continue current regimen. Having full panel of labs in a week or so for pre-op testing, will await those results in place of ordering additional bmp for safety screening today.       Relevant Medications   sildenafil (REVATIO) 20 MG tablet   lisinopril (ZESTRIL) 20 MG tablet   amLODipine (NORVASC) 5 MG tablet     Musculoskeletal and Integument   Localized, primary osteoarthritis of shoulder region    Stable and under good control with diclofenac gel prn, continue current regimen        Other   ED (erectile dysfunction)    Stable and under good control with prn sildenafil, continue current regimen          Follow up plan: Return in about 6 months (around 12/28/2019) for 6 month f/u.

## 2019-06-30 NOTE — Assessment & Plan Note (Signed)
Stable and under good control with diclofenac gel prn, continue current regimen

## 2019-06-30 NOTE — Assessment & Plan Note (Signed)
BPs stable and WNL, continue current regimen. Having full panel of labs in a week or so for pre-op testing, will await those results in place of ordering additional bmp for safety screening today.

## 2019-07-01 NOTE — Chronic Care Management (AMB) (Signed)
Chronic Care Management   Note  07/01/2019 Name: Tyler Sharp. MRN: [id number] DOB: 11/12/1950  Tyler Sharp. is a 68 y.o. year old male who is a primary care patient of Crissman, Jeannette How, MD. I reached out to [employer]. by phone today in response to a referral sent by [name]'s health plan.     Tyler Sharp was given information about Chronic Care Management services today including:  1. CCM service includes personalized support from designated clinical staff supervised by his physician, including individualized plan of care and coordination with other care providers 2. 24/7 contact phone numbers for assistance for urgent and routine care needs. 3. Service will only be billed when office clinical staff spend 20 minutes or more in a month to coordinate care. 4. Only one practitioner may furnish and bill the service in a calendar month. 5. The patient may stop CCM services at any time (effective at the end of the month) by phone call to the office staff. 6. The patient will be responsible for cost sharing (co-pay) of up to 20% of the service fee (after annual deductible is met).  Patient did not agree to enrollment in care management services and does not wish to consider at this time.  Follow up plan: The patient has been provided with contact information for the chronic care management team and has been advised to call with any health related questions or concerns.   [place], [place] [id number] Direct Dial: [email or web address]  Website: [email or web address]

## 2019-07-10 ENCOUNTER — Other Ambulatory Visit
Admission: RE | Admit: 2019-07-10 | Discharge: 2019-07-10 | Disposition: A | Discharge status: Home / Self Care | Payer: Medicare Other | Source: Ambulatory Visit | Attending: General Surgery | Admitting: General Surgery

## 2019-07-10 ENCOUNTER — Other Ambulatory Visit: Payer: Self-pay

## 2019-07-10 HISTORY — DX: Other complications of anesthesia, initial encounter: T88.59XA

## 2019-07-10 NOTE — Patient Instructions (Signed)
Your procedure is scheduled on: 07/15/2019 Wed Report to Same Day Surgery 2nd floor medical mall [facility] Entrance-take elevator on left to 2nd floor.  Check in with surgery information desk.) To find out your arrival time please call [phone] between 1PM - 3PM on 07/14/2019 Tues  Remember: Instructions that are not followed completely may result in serious medical risk, up to and including death, or upon the discretion of your surgeon and anesthesiologist your surgery may need to be rescheduled.    _x___ 1. Do not eat food after midnight the night before your procedure. You may drink clear liquids up to 2 hours before you are scheduled to arrive at the hospital for your procedure.  Do not drink clear liquids within 2 hours of your scheduled arrival to the hospital.  Clear liquids include  --Water or Apple juice without pulp  --Clear carbohydrate beverage such as ClearFast or Gatorade  --Black Coffee or Clear Tea (No milk, no creamers, do not add anything to                  the coffee or Tea Type 1 and type 2 diabetics should only drink water.   ____Ensure clear carbohydrate drink on the way to the hospital for bariatric patients  ____Ensure clear carbohydrate drink 3 hours before surgery.   No gum chewing or hard candies.     __x__ 2. No Alcohol for 24 hours before or after surgery.   __x__3. No Smoking or e-cigarettes for 24 prior to surgery.  Do not use any chewable tobacco products for at least 6 hour prior to surgery   ____  4. Bring all medications with you on the day of surgery if instructed.    __x__ 5. Notify your doctor if there is any change in your medical condition     (cold, fever, infections).    x___6. On the morning of surgery brush your teeth with toothpaste and water.  You may rinse your mouth with mouth wash if you wish.  Do not swallow any toothpaste or mouthwash.   Do not wear jewelry, make-up, hairpins, clips or nail polish.  Do not wear lotions,  powders, or perfumes. You may wear deodorant.  Do not shave 48 hours prior to surgery. Men may shave face and neck.  Do not bring valuables to the hospital.    [facility] is not responsible for any belongings or valuables.               Contacts, dentures or bridgework may not be worn into surgery.  Leave your suitcase in the car. After surgery it may be brought to your room.  For patients admitted to the hospital, discharge time is determined by your                       treatment team.  _  Patients discharged the day of surgery will not be allowed to drive home.  You will need someone to drive you home and stay with you the night of your procedure.    Please read over the following fact sheets that you were given:   [facility] Preparing for Surgery and or MRSA Information   _x___ Take anti-hypertensive listed below, cardiac, seizure, asthma,     anti-reflux and psychiatric medicines. These include:  1. amLODipine (NORVASC) 5 MG tablet  2.  3.  4.  5.  6.  ____Fleets enema or Magnesium Citrate as directed.   _x___  Use CHG Soap or sage wipes as directed on instruction sheet   ____ Use inhalers on the day of surgery and bring to hospital day of surgery  ____ Stop Metformin and Janumet 2 days prior to surgery.    ____ Take 1/2 of usual insulin dose the night before surgery and none on the morning     surgery.   _x___ Follow recommendations from Cardiologist, Pulmonologist or PCP regarding          stopping Aspirin, Coumadin, Plavix ,Eliquis, Effient, or Pradaxa, and Pletal.  X____Stop Anti-inflammatories such as Advil, Aleve, Ibuprofen, Motrin, Naproxen, Naprosyn, Goodies powders or aspirin products. OK to take Tylenol and                          Celebrex.   _x___ Stop supplements until after surgery.  But may continue Vitamin D, Vitamin B,       and multivitamin.   ____ Bring C-Pap to the hospital.

## 2019-07-13 ENCOUNTER — Encounter
Admission: RE | Admit: 2019-07-13 | Discharge: 2019-07-13 | Disposition: A | Discharge status: Home / Self Care | Payer: Medicare Other | Source: Ambulatory Visit | Attending: General Surgery | Admitting: General Surgery

## 2019-07-13 ENCOUNTER — Other Ambulatory Visit: Payer: Medicare Other

## 2019-07-13 ENCOUNTER — Other Ambulatory Visit: Payer: Self-pay

## 2019-07-13 DIAGNOSIS — Z20828 Contact with and (suspected) exposure to other viral communicable diseases: Secondary | ICD-10-CM | POA: Diagnosis not present

## 2019-07-13 DIAGNOSIS — I10 Essential (primary) hypertension: Secondary | ICD-10-CM | POA: Diagnosis not present

## 2019-07-13 DIAGNOSIS — Z01818 Encounter for other preprocedural examination: Secondary | ICD-10-CM | POA: Diagnosis not present

## 2019-07-13 LAB — BASIC METABOLIC PANEL
Anion gap: 9 (ref 5–15)
BUN: 12 mg/dL (ref 8–23)
CO2: 24 mmol/L (ref 22–32)
Calcium: 9.2 mg/dL (ref 8.9–10.3)
Chloride: 103 mmol/L (ref 98–111)
Creatinine, Ser: 0.85 mg/dL (ref 0.61–1.24)
GFR calc Af Amer: >60 mL/min (ref >60)
GFR calc non Af Amer: >60 mL/min (ref >60)
Glucose, Bld: 149 mg/dL — ABNORMAL HIGH (ref 70–99)
Potassium: 4.1 mmol/L (ref 3.5–5.1)
Sodium: 136 mmol/L (ref 135–145)

## 2019-07-13 LAB — CBC
HCT: 39.6 % (ref 39.0–52.0)
Hemoglobin: 13.6 g/dL (ref 13.0–17.0)
MCH: 31 pg (ref 26.0–34.0)
MCHC: 34.3 g/dL (ref 30.0–36.0)
MCV: 90.2 fL (ref 80.0–100.0)
Platelets: 219 10*3/uL (ref 150–400)
RBC: 4.39 MIL/uL (ref 4.22–5.81)
RDW: 12.3 % (ref 11.5–15.5)
WBC: 5 10*3/uL (ref 4.0–10.5)
nRBC: 0 % (ref 0.0–0.2)

## 2019-07-13 LAB — SARS CORONAVIRUS 2 (TAT 6-24 HRS): SARS Coronavirus 2: NEGATIVE

## 2019-07-15 ENCOUNTER — Encounter: Payer: Self-pay | Admitting: *Deleted

## 2019-07-15 ENCOUNTER — Encounter
Admission: RE | Disposition: A | Discharge status: Home / Self Care | Payer: Self-pay | Source: Home / Self Care | Attending: General Surgery

## 2019-07-15 ENCOUNTER — Ambulatory Visit: Payer: Medicare Other | Admitting: Certified Registered Nurse Anesthetist

## 2019-07-15 ENCOUNTER — Ambulatory Visit
Admission: RE | Admit: 2019-07-15 | Discharge: 2019-07-15 | Disposition: A | Discharge status: Home / Self Care | Payer: Medicare Other | Attending: General Surgery | Admitting: General Surgery

## 2019-07-15 DIAGNOSIS — I10 Essential (primary) hypertension: Secondary | ICD-10-CM | POA: Diagnosis not present

## 2019-07-15 DIAGNOSIS — M1990 Unspecified osteoarthritis, unspecified site: Secondary | ICD-10-CM | POA: Diagnosis not present

## 2019-07-15 DIAGNOSIS — Z79899 Other long term (current) drug therapy: Secondary | ICD-10-CM | POA: Insufficient documentation

## 2019-07-15 DIAGNOSIS — K429 Umbilical hernia without obstruction or gangrene: Secondary | ICD-10-CM | POA: Insufficient documentation

## 2019-07-15 DIAGNOSIS — Z96612 Presence of left artificial shoulder joint: Secondary | ICD-10-CM | POA: Diagnosis not present

## 2019-07-15 HISTORY — PX: UMBILICAL HERNIA REPAIR: SHX196

## 2019-07-15 SURGERY — REPAIR, HERNIA, UMBILICAL, ADULT
Anesthesia: General

## 2019-07-15 MED ORDER — DEXAMETHASONE SODIUM PHOSPHATE 10 MG/ML IJ SOLN
INTRAMUSCULAR | Status: DC | PRN
  Administered 2019-07-15: 8 mg via INTRAVENOUS

## 2019-07-15 MED ORDER — PHENYLEPHRINE HCL (PRESSORS) 10 MG/ML IV SOLN
INTRAVENOUS | Status: DC | PRN
  Administered 2019-07-15 (×2): 100 ug via INTRAVENOUS

## 2019-07-15 MED ORDER — PROPOFOL 10 MG/ML IV BOLUS
INTRAVENOUS | Status: DC | PRN
  Administered 2019-07-15: 70 mg via INTRAVENOUS
  Administered 2019-07-15: 200 mg via INTRAVENOUS

## 2019-07-15 MED ORDER — HYDROCODONE-ACETAMINOPHEN 5-325 MG PO TABS: 1.0000 | ORAL_TABLET | ORAL | 0 refills | Status: DC | PRN

## 2019-07-15 MED ORDER — ACETAMINOPHEN 10 MG/ML IV SOLN
INTRAVENOUS | Status: DC | PRN
  Administered 2019-07-15: 1000 mg via INTRAVENOUS

## 2019-07-15 MED ORDER — PROPOFOL 10 MG/ML IV BOLUS
INTRAVENOUS | Status: AC
  Filled 2019-07-15: qty 40

## 2019-07-15 MED ORDER — BUPIVACAINE HCL 0.5 % IJ SOLN
INTRAMUSCULAR | Status: DC | PRN
  Administered 2019-07-15: 30 mL

## 2019-07-15 MED ORDER — FENTANYL CITRATE (PF) 100 MCG/2ML IJ SOLN
INTRAMUSCULAR | Status: AC
  Filled 2019-07-15: qty 2

## 2019-07-15 MED ORDER — FENTANYL CITRATE (PF) 100 MCG/2ML IJ SOLN: 25.0000 ug | INTRAMUSCULAR | Status: DC | PRN

## 2019-07-15 MED ORDER — ONDANSETRON HCL 4 MG/2ML IJ SOLN
INTRAMUSCULAR | Status: DC | PRN
  Administered 2019-07-15: 4 mg via INTRAVENOUS

## 2019-07-15 MED ORDER — LIDOCAINE HCL (CARDIAC) PF 100 MG/5ML IV SOSY
PREFILLED_SYRINGE | INTRAVENOUS | Status: DC | PRN
  Administered 2019-07-15: 80 mg via INTRAVENOUS

## 2019-07-15 MED ORDER — ONDANSETRON HCL 4 MG/2ML IJ SOLN: 4.0000 mg | Freq: Once | INTRAMUSCULAR | Status: DC | PRN

## 2019-07-15 MED ORDER — KETOROLAC TROMETHAMINE 30 MG/ML IJ SOLN
INTRAMUSCULAR | Status: DC | PRN
  Administered 2019-07-15: 15 mg via INTRAVENOUS

## 2019-07-15 MED ORDER — ACETAMINOPHEN 10 MG/ML IV SOLN
INTRAVENOUS | Status: AC
  Filled 2019-07-15: qty 100

## 2019-07-15 MED ORDER — LACTATED RINGERS IV SOLN
INTRAVENOUS | Status: DC
  Administered 2019-07-15: 12:00:00 via INTRAVENOUS

## 2019-07-15 MED ORDER — FAMOTIDINE 20 MG PO TABS: 20.0000 mg | ORAL_TABLET | Freq: Once | ORAL | Status: DC

## 2019-07-15 MED ORDER — SUCCINYLCHOLINE CHLORIDE 20 MG/ML IJ SOLN
INTRAMUSCULAR | Status: DC | PRN
  Administered 2019-07-15: 80 mg via INTRAVENOUS

## 2019-07-15 MED ORDER — FENTANYL CITRATE (PF) 100 MCG/2ML IJ SOLN
INTRAMUSCULAR | Status: DC | PRN
  Administered 2019-07-15 (×2): 25 ug via INTRAVENOUS
  Administered 2019-07-15: 50 ug via INTRAVENOUS

## 2019-07-15 MED ORDER — GLYCOPYRROLATE 0.2 MG/ML IJ SOLN
INTRAMUSCULAR | Status: DC | PRN
  Administered 2019-07-15: 0.2 mg via INTRAVENOUS

## 2019-07-15 SURGICAL SUPPLY — 31 items
BLADE SURG 15 STRL SS SAFETY (BLADE) ×3 IMPLANT
CANISTER SUCT 1200ML W/VALVE (MISCELLANEOUS) ×3 IMPLANT
CHLORAPREP W/TINT 26 (MISCELLANEOUS) ×3 IMPLANT
CLOSURE WOUND 1/2 X4 (GAUZE/BANDAGES/DRESSINGS)
COVER WAND RF STERILE (DRAPES) ×3 IMPLANT
DRAPE LAPAROTOMY 100X77 ABD (DRAPES) ×3 IMPLANT
DRSG TEGADERM 4X4.75 (GAUZE/BANDAGES/DRESSINGS) ×3 IMPLANT
DRSG TELFA 4X3 1S NADH ST (GAUZE/BANDAGES/DRESSINGS) ×3 IMPLANT
ELECT REM PT RETURN 9FT ADLT (ELECTROSURGICAL) ×3
ELECTRODE REM PT RTRN 9FT ADLT (ELECTROSURGICAL) ×1 IMPLANT
GLOVE BIO SURGEON STRL SZ7.5 (GLOVE) ×3 IMPLANT
GLOVE INDICATOR 8.0 STRL GRN (GLOVE) ×3 IMPLANT
GOWN STRL REUS W/ TWL LRG LVL3 (GOWN DISPOSABLE) ×2 IMPLANT
GOWN STRL REUS W/TWL LRG LVL3 (GOWN DISPOSABLE) ×4
KIT TURNOVER KIT A (KITS) ×3 IMPLANT
LABEL OR SOLS (LABEL) ×3 IMPLANT
NEEDLE HYPO 22GX1.5 SAFETY (NEEDLE) ×6 IMPLANT
NEEDLE HYPO 25X1 1.5 SAFETY (NEEDLE) ×3 IMPLANT
NS IRRIG 500ML POUR BTL (IV SOLUTION) ×3 IMPLANT
PACK BASIN MINOR ARMC (MISCELLANEOUS) ×3 IMPLANT
STRIP CLOSURE SKIN 1/2X4 (GAUZE/BANDAGES/DRESSINGS) IMPLANT
SUT PROLENE 0 CT 1 30 (SUTURE) ×3 IMPLANT
SUT SURGILON 0 BLK (SUTURE) ×6 IMPLANT
SUT VIC AB 3-0 54X BRD REEL (SUTURE) ×2 IMPLANT
SUT VIC AB 3-0 BRD 54 (SUTURE) ×4
SUT VIC AB 3-0 SH 27 (SUTURE) ×2
SUT VIC AB 3-0 SH 27X BRD (SUTURE) ×1 IMPLANT
SUT VIC AB 4-0 FS2 27 (SUTURE) ×3 IMPLANT
SWABSTK COMLB BENZOIN TINCTURE (MISCELLANEOUS) ×3 IMPLANT
SYR 10ML LL (SYRINGE) ×3 IMPLANT
SYR 3ML LL SCALE MARK (SYRINGE) ×3 IMPLANT

## 2019-07-15 NOTE — Anesthesia Post-op Follow-up Note (Signed)
Anesthesia QCDR form completed.        

## 2019-07-15 NOTE — Anesthesia Postprocedure Evaluation (Signed)
Anesthesia Post Note  Patient: Tyler Sharp.  Procedure(s) Performed: HERNIA REPAIR UMBILICAL ADULT (N/A )  Patient location during evaluation: PACU Anesthesia Type: General Level of consciousness: awake and alert and oriented Pain management: pain level controlled Vital Signs Assessment: post-procedure vital signs reviewed and stable Respiratory status: spontaneous breathing Cardiovascular status: blood pressure returned to baseline Anesthetic complications: no     Last Vitals:  Vitals:   07/15/19 1252 07/15/19 1307  BP: 130/85 119/83  Pulse: 86 71  Resp: 12 11  Temp: (!) 36.4 C   SpO2: 100% 99%    Last Pain:  Vitals:   07/15/19 1252  TempSrc:   PainSc: 0-No pain                 Shawnna Pancake

## 2019-07-15 NOTE — Anesthesia Preprocedure Evaluation (Signed)
Anesthesia Evaluation  Patient identified by MRN, date of birth, ID band Patient awake    Reviewed: Allergy & Precautions, NPO status , Patient's Chart, lab work & pertinent test results  Airway Mallampati: II  TM Distance: >3 FB     Dental   Pulmonary neg pulmonary ROS,    Pulmonary exam normal        Cardiovascular hypertension, Normal cardiovascular exam     Neuro/Psych negative psych ROS   GI/Hepatic Neg liver ROS,   Endo/Other  negative endocrine ROS  Renal/GU negative Renal ROS  negative genitourinary   Musculoskeletal  (+) Arthritis , Osteoarthritis,    Abdominal Normal abdominal exam  (+)   Peds negative pediatric ROS (+)  Hematology negative hematology ROS (+)   Anesthesia Other Findings Past Medical History: No date: Complication of anesthesia No date: Hypertension  Reproductive/Obstetrics                             Anesthesia Physical Anesthesia Plan  ASA: II  Anesthesia Plan: General   Post-op Pain Management:    Induction: Intravenous  PONV Risk Score and Plan:   Airway Management Planned: LMA  Additional Equipment:   Intra-op Plan:   Post-operative Plan: Extubation in OR  Informed Consent: I have reviewed the patients History and Physical, chart, labs and discussed the procedure including the risks, benefits and alternatives for the proposed anesthesia with the patient or authorized representative who has indicated his/her understanding and acceptance.     Dental advisory given  Plan Discussed with: CRNA and Surgeon  Anesthesia Plan Comments:         Anesthesia Quick Evaluation

## 2019-07-15 NOTE — Discharge Instructions (Signed)
AMBULATORY SURGERY  °DISCHARGE INSTRUCTIONS ° ° °1) The drugs that you were given will stay in your system until tomorrow so for the next 24 hours you should not: ° °A) Drive an automobile °B) Make any legal decisions °C) Drink any alcoholic beverage ° ° °2) You may resume regular meals tomorrow.  Today it is better to start with liquids and gradually work up to solid foods. ° °You may eat anything you prefer, but it is better to start with liquids, then soup and crackers, and gradually work up to solid foods. ° ° °3) Please notify your doctor immediately if you have any unusual bleeding, trouble breathing, redness and pain at the surgery site, drainage, fever, or pain not relieved by medication. ° °Please contact your physician with any problems or Same Day Surgery at [phone], Monday through Friday 6 am to 4 pm, or [place] [facility] [place] Main number at [phone]. °

## 2019-07-15 NOTE — H&P (Signed)
Tyler Sharp [id number] [birth year]     HPI:   Healthy 68 y/o male with a symptomatic umbilical hernia.  For elective repait.   Medications Prior to Admission  Medication Sig Dispense Refill Last Dose  . amLODipine (NORVASC) 5 MG tablet Take 1 tablet (5 mg total) by mouth daily. 30 tablet 0 07/15/2019 at Unknown time  . diclofenac Sodium (VOLTAREN) 1 % GEL Apply 2 g topically 4 (four) times daily as needed (arthritis pain).   Past Month at Unknown time  . ibuprofen (ADVIL) 800 MG tablet Take 800 mg by mouth every 8 (eight) hours as needed for moderate pain.    Past Week at Unknown time  . lisinopril (ZESTRIL) 20 MG tablet Take 1 tablet (20 mg total) by mouth daily. 90 tablet 1 07/14/2019 at Unknown time  . UNABLE TO FIND Take 1 capsule by mouth 2 (two) times daily. Prostagenix     . sildenafil (REVATIO) 20 MG tablet Take 1 tablet (20 mg total) by mouth daily as needed. 50 tablet 5    No Known Allergies Past Medical History:  Diagnosis Date  . Complication of anesthesia   . Hypertension    Past Surgical History:  Procedure Laterality Date  . APPENDECTOMY    . CHOLECYSTECTOMY    . KNEE SURGERY Right   . SHOULDER SURGERY Left   . TOTAL SHOULDER REPLACEMENT     Social History   Socioeconomic History  . Marital status: Married    Spouse name: Not on file  . Number of children: Not on file  . Years of education: Not on file  . Highest education level: Bachelor's degree (e.g., BA, AB, BS)  Occupational History  . Occupation: retired  Scientific laboratory technician  . Financial resource strain: Not hard at all  . Food insecurity    Worry: Never true    Inability: Never true  . Transportation needs    Medical: No    Non-medical: No  Tobacco Use  . Smoking status: Never Smoker  . Smokeless tobacco: Never Used  Substance and Sexual Activity  . Alcohol use: Yes    Alcohol/week: 2.0 standard drinks    Types: 2 Shots of liquor per week    Comment: occasional  . Drug use: No  . Sexual  activity: Not on file  Lifestyle  . Physical activity    Days per week: 7 days    Minutes per session: 30 min  . Stress: Not at all  Relationships  . Social connections    Talks on phone: More than three times a week    Gets together: More than three times a week    Attends religious service: More than 4 times per year    Active member of club or organization: Yes    Attends meetings of clubs or organizations: More than 4 times per year    Relationship status: Married  . Intimate partner violence    Fear of current or ex partner: No    Emotionally abused: No    Physically abused: No    Forced sexual activity: No  Other Topics Concern  . Not on file  Social History Narrative  . Not on file   Social History   Social History Narrative  . Not on file     ROS: Negative.     PE: HEENT: Negative. Lungs: Clear. Cardio: RR.  Assessment/Plan:  Proceed with planned umbilical hernia repair.    Tyler Sharp 07/15/2019

## 2019-07-15 NOTE — Anesthesia Procedure Notes (Signed)
Procedure Name: LMA Insertion Date/Time: 07/15/2019 12:16 PM Performed by: Lowry Bowl, CRNA Pre-anesthesia Checklist: Patient identified, Emergency Drugs available, Suction available and Patient being monitored Patient Re-evaluated:Patient Re-evaluated prior to induction Oxygen Delivery Method: Circle system utilized Preoxygenation: Pre-oxygenation with 100% oxygen Induction Type: IV induction LMA: LMA inserted LMA Size: 5.0 Number of attempts: 2 (First attempt unsuccessful; Additional propofol and succs administered to loosen patient's jaw) Placement Confirmation: positive ETCO2 and breath sounds checked- equal and bilateral Tube secured with: Tape Dental Injury: Teeth and Oropharynx as per pre-operative assessment

## 2019-07-15 NOTE — Transfer of Care (Signed)
Immediate Anesthesia Transfer of Care Note  Patient: Tyler Sharp.  Procedure(s) Performed: HERNIA REPAIR UMBILICAL ADULT (N/A )  Patient Location: PACU  Anesthesia Type:General  Level of Consciousness: awake, alert , oriented and patient cooperative  Airway & Oxygen Therapy: Patient Spontanous Breathing  Post-op Assessment: Report given to RN, Post -op Vital signs reviewed and stable and Patient moving all extremities  Post vital signs: Reviewed and stable  Last Vitals:  Vitals Value Taken Time  BP 130/85 07/15/19 1252  Temp 36.4 C 07/15/19 1252  Pulse 85 07/15/19 1253  Resp 24 07/15/19 1253  SpO2 100 % 07/15/19 1253  Vitals shown include unvalidated device data.  Last Pain:  Vitals:   07/15/19 1104  TempSrc:   PainSc: 0-No pain         Complications: No apparent anesthesia complications

## 2019-07-16 NOTE — Op Note (Signed)
Preoperative diagnosis: Umbilical hernia.  Postoperative diagnosis: Same.  Operative procedure: Primary repair of umbilical hernia.  Operating surgeon: Hervey Ard, MD  Anesthesia: General by LMA, Marcaine 0.5%, plain, 30 cc.  Estimated blood loss: Less than 5 cc.  Clinical note: This 68 year old man has had increasing discomfort from a small umbilical hernia.  He was admitted for elective repair.  Preoperative antibiotics were not utilized as it was not expected he would require prosthetic mesh.  Operative note: The patient underwent general anesthesia without difficulty.  Hered previously been removed with clippers.  An infraumbilical incision was made after instillation of local anesthesia.  The skin was incised sharply and the remaining dissection completed with electrocautery.  The umbilical skin was elevated off the underlying sac.  This was transected at the fascial level and discarded.  The defect was about 1 cm in diameter.  The undersurface of the fascia was cleared.  Interrupted 0 Surgilon sutures were placed sequentially and then tied.  The umbilical skin was tacked to the fascia with a 3-0 Vicryl figure-of-eight suture.  The adipose layer was closed with a running 3-0 Vicryl suture.  The end was closed with a running 4-0 Vicryl subcuticular suture.  Benzoin, Steri-Strips, Telfa and Tegaderm dressing applied.  The patient tolerated the procedure well and was taken to recovery in stable condition.

## 2019-08-26 DIAGNOSIS — H2513 Age-related nuclear cataract, bilateral: Secondary | ICD-10-CM | POA: Diagnosis not present

## 2019-08-27 DIAGNOSIS — L821 Other seborrheic keratosis: Secondary | ICD-10-CM | POA: Diagnosis not present

## 2019-08-27 DIAGNOSIS — Z08 Encounter for follow-up examination after completed treatment for malignant neoplasm: Secondary | ICD-10-CM | POA: Diagnosis not present

## 2019-08-27 DIAGNOSIS — Z85828 Personal history of other malignant neoplasm of skin: Secondary | ICD-10-CM | POA: Diagnosis not present

## 2019-08-27 DIAGNOSIS — D225 Melanocytic nevi of trunk: Secondary | ICD-10-CM | POA: Diagnosis not present

## 2019-08-27 DIAGNOSIS — Z872 Personal history of diseases of the skin and subcutaneous tissue: Secondary | ICD-10-CM | POA: Diagnosis not present

## 2019-08-27 DIAGNOSIS — L570 Actinic keratosis: Secondary | ICD-10-CM | POA: Diagnosis not present

## 2019-08-27 DIAGNOSIS — X32XXXA Exposure to sunlight, initial encounter: Secondary | ICD-10-CM | POA: Diagnosis not present

## 2019-09-22 DIAGNOSIS — H2511 Age-related nuclear cataract, right eye: Secondary | ICD-10-CM | POA: Diagnosis not present

## 2019-09-22 DIAGNOSIS — I10 Essential (primary) hypertension: Secondary | ICD-10-CM | POA: Diagnosis not present

## 2019-09-30 ENCOUNTER — Encounter: Payer: Self-pay | Admitting: Ophthalmology

## 2019-09-30 ENCOUNTER — Other Ambulatory Visit: Payer: Self-pay

## 2019-10-02 ENCOUNTER — Other Ambulatory Visit
Admission: RE | Admit: 2019-10-02 | Discharge: 2019-10-02 | Disposition: A | Discharge status: Home / Self Care | Payer: Medicare Other | Source: Ambulatory Visit | Attending: Ophthalmology | Admitting: Ophthalmology

## 2019-10-02 ENCOUNTER — Other Ambulatory Visit: Payer: Self-pay

## 2019-10-02 DIAGNOSIS — Z01812 Encounter for preprocedural laboratory examination: Secondary | ICD-10-CM | POA: Insufficient documentation

## 2019-10-02 DIAGNOSIS — Z20822 Contact with and (suspected) exposure to covid-19: Secondary | ICD-10-CM | POA: Diagnosis not present

## 2019-10-02 LAB — SARS CORONAVIRUS 2 (TAT 6-24 HRS): SARS Coronavirus 2: NEGATIVE

## 2019-10-02 NOTE — Discharge Instructions (Signed)
General Anesthesia, Adult, Care After This sheet gives you information about how to care for yourself after your procedure. Your health care provider may also give you more specific instructions. If you have problems or questions, contact your health care provider. What can I expect after the procedure? After the procedure, the following side effects are common:  Pain or discomfort at the IV site.  Nausea.  Vomiting.  Sore throat.  Trouble concentrating.  Feeling cold or chills.  Weak or tired.  Sleepiness and fatigue.  Soreness and body aches. These side effects can affect parts of the body that were not involved in surgery. Follow these instructions at home:  For at least 24 hours after the procedure:  Have a responsible adult stay with you. It is important to have someone help care for you until you are awake and alert.  Rest as needed.  Do not: ? Participate in activities in which you could fall or become injured. ? Drive. ? Use heavy machinery. ? Drink alcohol. ? Take sleeping pills or medicines that cause drowsiness. ? Make important decisions or sign legal documents. ? Take care of children on your own. Eating and drinking  Follow any instructions from your health care provider about eating or drinking restrictions.  When you feel hungry, start by eating small amounts of foods that are soft and easy to digest (bland), such as toast. Gradually return to your regular diet.  Drink enough fluid to keep your urine pale yellow.  If you vomit, rehydrate by drinking water, juice, or clear broth. General instructions  If you have sleep apnea, surgery and certain medicines can increase your risk for breathing problems. Follow instructions from your health care provider about wearing your sleep device: ? Anytime you are sleeping, including during daytime naps. ? While taking prescription pain medicines, sleeping medicines, or medicines that make you drowsy.  Return to  your normal activities as told by your health care provider. Ask your health care provider what activities are safe for you.  Take over-the-counter and prescription medicines only as told by your health care provider.  If you smoke, do not smoke without supervision.  Keep all follow-up visits as told by your health care provider. This is important. Contact a health care provider if:  You have nausea or vomiting that does not get better with medicine.  You cannot eat or drink without vomiting.  You have pain that does not get better with medicine.  You are unable to pass urine.  You develop a skin rash.  You have a fever.  You have redness around your IV site that gets worse. Get help right away if:  You have difficulty breathing.  You have chest pain.  You have blood in your urine or stool, or you vomit blood. Summary  After the procedure, it is common to have a sore throat or nausea. It is also common to feel tired.  Have a responsible adult stay with you for the first 24 hours after general anesthesia. It is important to have someone help care for you until you are awake and alert.  When you feel hungry, start by eating small amounts of foods that are soft and easy to digest (bland), such as toast. Gradually return to your regular diet.  Drink enough fluid to keep your urine pale yellow.  Return to your normal activities as told by your health care provider. Ask your health care provider what activities are safe for you. This information is not   intended to replace advice given to you by your health care provider. Make sure you discuss any questions you have with your health care provider. Document Revised: 07/26/2017 Document Reviewed: 03/08/2017 Elsevier Patient Education  2020 [employer] Inc[employer]  Cataract Surgery, Care After This sheet gives you information about how to care for yourself after your procedure. Your health care provider may also give you more specific  instructions. If you have problems or questions, contact your health care provider. What can I expect after the procedure? After the procedure, it is common to have:  Itching.  Discomfort.  Fluid discharge.  Sensitivity to light and to touch.  Bruising in or around the eye.  Mild blurred vision. Follow these instructions at home: Eye care   Do not touch or rub your eyes.  Protect your eyes as told by your health care provider. You may be told to wear a protective eye shield or sunglasses.  Do not put a contact lens into the affected eye or eyes until your health care provider approves.  Keep the area around your eye clean and dry: ? Avoid swimming. ? Do not allow water to hit you directly in the face while showering. ? Keep soap and shampoo out of your eyes.  Check your eye every day for signs of infection. Watch for: ? Redness, swelling, or pain. ? Fluid, blood, or pus. ? Warmth. ? A bad smell. ? Vision that is getting worse. ? Sensitivity that is getting worse. Activity  Do not drive for 24 hours if you were given a sedative during your procedure.  Avoid strenuous activities, such as playing contact sports, for as long as told by your health care provider.  Do not drive or use heavy machinery until your health care provider approves.  Do not bend or lift heavy objects. Bending increases pressure in the eye. You can walk, climb stairs, and do light household chores.  Ask your health care provider when you can return to work. If you work in a dusty environment, you may be advised to wear protective eyewear for a period of time. General instructions  Take or apply over-the-counter and prescription medicines only as told by your health care provider. This includes eye drops.  Keep all follow-up visits as told by your health care provider. This is important. Contact a health care provider if:  You have increased bruising around your eye.  You have pain that is  not helped with medicine.  You have a fever.  You have redness, swelling, or pain in your eye.  You have fluid, blood, or pus coming from your incision.  Your vision gets worse.  Your sensitivity to light gets worse. Get help right away if:  You have sudden loss of vision.  You see flashes of light or spots (floaters).  You have severe eye pain.  You develop nausea or vomiting. Summary  After your procedure, it is common to have itching, discomfort, bruising, fluid discharge, or sensitivity to light.  Follow instructions from your health care provider about caring for your eye after the procedure.  Do not rub your eye after the procedure. You may need to wear eye protection or sunglasses. Do not wear contact lenses. Keep the area around your eye clean and dry.  Avoid activities that require a lot of effort. These include playing sports and lifting heavy objects.  Contact a health care provider if you have increased bruising, pain that does not go away, or a fever. Get help right   away if you suddenly lose your vision, see flashes of light or spots, or have severe pain in the eye. This information is not intended to replace advice given to you by your health care provider. Make sure you discuss any questions you have with your health care provider. Document Revised: 05/19/2019 Document Reviewed: 01/20/2018 Elsevier Patient Education  2020 Elsevier Inc.  

## 2019-10-06 ENCOUNTER — Other Ambulatory Visit: Payer: Self-pay

## 2019-10-06 ENCOUNTER — Ambulatory Visit: Payer: Medicare Other | Admitting: Anesthesiology

## 2019-10-06 ENCOUNTER — Encounter
Admission: RE | Disposition: A | Discharge status: Home / Self Care | Payer: Self-pay | Source: Home / Self Care | Attending: Ophthalmology

## 2019-10-06 ENCOUNTER — Encounter: Payer: Self-pay | Admitting: Ophthalmology

## 2019-10-06 ENCOUNTER — Ambulatory Visit
Admission: RE | Admit: 2019-10-06 | Discharge: 2019-10-06 | Disposition: A | Discharge status: Home / Self Care | Payer: Medicare Other | Attending: Ophthalmology | Admitting: Ophthalmology

## 2019-10-06 DIAGNOSIS — H2511 Age-related nuclear cataract, right eye: Secondary | ICD-10-CM | POA: Insufficient documentation

## 2019-10-06 DIAGNOSIS — I10 Essential (primary) hypertension: Secondary | ICD-10-CM | POA: Diagnosis not present

## 2019-10-06 DIAGNOSIS — H25811 Combined forms of age-related cataract, right eye: Secondary | ICD-10-CM | POA: Diagnosis not present

## 2019-10-06 HISTORY — DX: Unspecified osteoarthritis, unspecified site: M19.90

## 2019-10-06 HISTORY — PX: CATARACT EXTRACTION W/PHACO: SHX586

## 2019-10-06 HISTORY — DX: Nausea with vomiting, unspecified: R11.2

## 2019-10-06 HISTORY — DX: Other specified postprocedural states: Z98.890

## 2019-10-06 SURGERY — PHACOEMULSIFICATION, CATARACT, WITH IOL INSERTION
Anesthesia: Monitor Anesthesia Care | Site: Eye | Laterality: Right

## 2019-10-06 MED ORDER — EPINEPHRINE PF 1 MG/ML IJ SOLN
INTRAOCULAR | Status: DC | PRN
  Administered 2019-10-06: 62 mL via OPHTHALMIC

## 2019-10-06 MED ORDER — LACTATED RINGERS IV SOLN: INTRAVENOUS | Status: DC

## 2019-10-06 MED ORDER — BRIMONIDINE TARTRATE-TIMOLOL 0.2-0.5 % OP SOLN
OPHTHALMIC | Status: DC | PRN
  Administered 2019-10-06: 1 [drp] via OPHTHALMIC

## 2019-10-06 MED ORDER — ARMC OPHTHALMIC DILATING DROPS
1.0000 "application " | OPHTHALMIC | Status: DC | PRN
  Administered 2019-10-06 (×3): 1 via OPHTHALMIC

## 2019-10-06 MED ORDER — FENTANYL CITRATE (PF) 100 MCG/2ML IJ SOLN
INTRAMUSCULAR | Status: DC | PRN
  Administered 2019-10-06: 100 ug via INTRAVENOUS

## 2019-10-06 MED ORDER — NA CHONDROIT SULF-NA HYALURON 40-17 MG/ML IO SOLN
INTRAOCULAR | Status: DC | PRN
  Administered 2019-10-06: 1 mL via INTRAOCULAR

## 2019-10-06 MED ORDER — LIDOCAINE HCL (PF) 2 % IJ SOLN
INTRAOCULAR | Status: DC | PRN
  Administered 2019-10-06: 1 mL

## 2019-10-06 MED ORDER — MIDAZOLAM HCL 2 MG/2ML IJ SOLN
INTRAMUSCULAR | Status: DC | PRN
  Administered 2019-10-06: 2 mg via INTRAVENOUS

## 2019-10-06 MED ORDER — MOXIFLOXACIN HCL 0.5 % OP SOLN
OPHTHALMIC | Status: DC | PRN
  Administered 2019-10-06: 0.2 mL via OPHTHALMIC

## 2019-10-06 MED ORDER — TETRACAINE HCL 0.5 % OP SOLN
1.0000 [drp] | OPHTHALMIC | Status: DC | PRN
  Administered 2019-10-06 (×3): 1 [drp] via OPHTHALMIC

## 2019-10-06 MED ORDER — ONDANSETRON HCL 4 MG/2ML IJ SOLN: 4.0000 mg | Freq: Once | INTRAMUSCULAR | Status: DC | PRN

## 2019-10-06 SURGICAL SUPPLY — 22 items
CANNULA ANT/CHMB 27G (MISCELLANEOUS) ×2 IMPLANT
CANNULA ANT/CHMB 27GA (MISCELLANEOUS) ×6 IMPLANT
GLOVE SURG LX 8.0 MICRO (GLOVE) ×2
GLOVE SURG LX STRL 8.0 MICRO (GLOVE) ×1 IMPLANT
GLOVE SURG TRIUMPH 8.0 PF LTX (GLOVE) ×3 IMPLANT
GOWN STRL REUS W/ TWL LRG LVL3 (GOWN DISPOSABLE) ×2 IMPLANT
GOWN STRL REUS W/TWL LRG LVL3 (GOWN DISPOSABLE) ×4
LENS IOL ACRSF IQ VT 15 20.0 IMPLANT
LENS IOL ACRYSOF VIVITY 20.0 ×2 IMPLANT
LENS IOL VIVITY 015 20.0 ×1 IMPLANT
MARKER SKIN DUAL TIP RULER LAB (MISCELLANEOUS) ×3 IMPLANT
NDL FILTER BLUNT 18X1 1/2 (NEEDLE) ×1 IMPLANT
NDL RETROBULBAR .5 NSTRL (NEEDLE) ×3 IMPLANT
NEEDLE FILTER BLUNT 18X 1/2SAF (NEEDLE) ×2
NEEDLE FILTER BLUNT 18X1 1/2 (NEEDLE) ×1 IMPLANT
PACK EYE AFTER SURG (MISCELLANEOUS) ×3 IMPLANT
PACK OPTHALMIC (MISCELLANEOUS) ×3 IMPLANT
PACK PORFILIO (MISCELLANEOUS) ×3 IMPLANT
SYR 3ML LL SCALE MARK (SYRINGE) ×3 IMPLANT
SYR TB 1ML LUER SLIP (SYRINGE) ×3 IMPLANT
WATER STERILE IRR 250ML POUR (IV SOLUTION) ×3 IMPLANT
WIPE NON LINTING 3.25X3.25 (MISCELLANEOUS) ×3 IMPLANT

## 2019-10-06 NOTE — Anesthesia Postprocedure Evaluation (Signed)
Anesthesia Post Note  Patient: Tyler Sharp.  Procedure(s) Performed: CATARACT EXTRACTION PHACO AND INTRAOCULAR LENS PLACEMENT (IOC) RIGHT VIVITY LENS 7.89  00:43.4 (Right Eye)     Patient location during evaluation: PACU Anesthesia Type: MAC Level of consciousness: awake Pain management: pain level controlled Vital Signs Assessment: post-procedure vital signs reviewed and stable Respiratory status: respiratory function stable Cardiovascular status: stable Postop Assessment: no apparent nausea or vomiting Anesthetic complications: no    Veda Canning

## 2019-10-06 NOTE — Anesthesia Preprocedure Evaluation (Signed)
Anesthesia Evaluation  Patient identified by MRN, date of birth, ID band Patient awake    Reviewed: Allergy & Precautions, NPO status   Airway Mallampati: II  TM Distance: >3 FB     Dental   Pulmonary neg pulmonary ROS,    breath sounds clear to auscultation       Cardiovascular hypertension,  Rhythm:Regular Rate:Normal     Neuro/Psych    GI/Hepatic negative GI ROS,   Endo/Other    Renal/GU      Musculoskeletal  (+) Arthritis ,   Abdominal   Peds  Hematology   Anesthesia Other Findings   Reproductive/Obstetrics                             Anesthesia Physical Anesthesia Plan  ASA: II  Anesthesia Plan: MAC   Post-op Pain Management:    Induction: Intravenous  PONV Risk Score and Plan: TIVA, Midazolam and Treatment may vary due to age or medical condition  Airway Management Planned: Natural Airway and Nasal Cannula  Additional Equipment:   Intra-op Plan:   Post-operative Plan:   Informed Consent: I have reviewed the patients History and Physical, chart, labs and discussed the procedure including the risks, benefits and alternatives for the proposed anesthesia with the patient or authorized representative who has indicated his/her understanding and acceptance.       Plan Discussed with: CRNA  Anesthesia Plan Comments:         Anesthesia Quick Evaluation

## 2019-10-06 NOTE — H&P (Signed)
.  All labs reviewed. Abnormal studies sent to patients PCP when indicated.  Previous H&P reviewed, patient examined, there are NO CHANGES.  Tyler Sharp Porfilio3/2/202111:28 AM

## 2019-10-06 NOTE — Op Note (Signed)
PREOPERATIVE DIAGNOSIS:  Nuclear sclerotic cataract of the right eye.   POSTOPERATIVE DIAGNOSIS:  H25.11 Cataract   OPERATIVE PROCEDURE:@   SURGEON:  Birder Robson, MD.   ANESTHESIA:  Anesthesiologist: Veda Canning, MD CRNA: Silvana Newness, CRNA  1.      Managed anesthesia care. 2.      0.27ml of Shugarcaine was instilled in the eye following the paracentesis.   COMPLICATIONS:  None.   TECHNIQUE:   Stop and chop   DESCRIPTION OF PROCEDURE:  The patient was examined and consented in the preoperative holding area where the aforementioned topical anesthesia was applied to the right eye and then brought back to the Operating Room where the right eye was prepped and draped in the usual sterile ophthalmic fashion and a lid speculum was placed. A paracentesis was created with the side port blade and the anterior chamber was filled with viscoelastic. A near clear corneal incision was performed with the steel keratome. A continuous curvilinear capsulorrhexis was performed with a cystotome followed by the capsulorrhexis forceps. Hydrodissection and hydrodelineation were carried out with BSS on a blunt cannula. The lens was removed in a stop and chop  technique and the remaining cortical material was removed with the irrigation-aspiration handpiece. The capsular bag was inflated with viscoelastic and the Technis [id number]  lens was placed in the capsular bag without complication. The remaining viscoelastic was removed from the eye with the irrigation-aspiration handpiece. The wounds were hydrated. The anterior chamber was flushed with BSS and the eye was inflated to physiologic pressure. 0.58ml of Vigamox was placed in the anterior chamber. The wounds were found to be water tight. The eye was dressed with Combigan. The patient was given protective glasses to wear throughout the day and a shield with which to sleep tonight. The patient was also given drops with which to begin a drop regimen today and will  follow-up with me in one day. Implant Name Type Inv. Item Serial No. Manufacturer Lot No. LRB No. Used Action  LENS IOL ACRYSOF VIVITY 20.0 - [id number]  LENS IOL ACRYSOF VIVITY 20.0 [id number] ALCON  Right 1 Implanted   Procedure(s): CATARACT EXTRACTION PHACO AND INTRAOCULAR LENS PLACEMENT (IOC) RIGHT VIVITY LENS 7.89  00:43.4 (Right)  Electronically signed: Birder Robson 10/06/2019 12:04 PM

## 2019-10-06 NOTE — Transfer of Care (Signed)
Immediate Anesthesia Transfer of Care Note  Patient: Tyler Sharp.  Procedure(s) Performed: CATARACT EXTRACTION PHACO AND INTRAOCULAR LENS PLACEMENT (IOC) RIGHT VIVITY LENS (Right )  Patient Location: PACU  Anesthesia Type: MAC  Level of Consciousness: awake, alert  and patient cooperative  Airway and Oxygen Therapy: Patient Spontanous Breathing and Patient connected to supplemental oxygen  Post-op Assessment: Post-op Vital signs reviewed, Patient's Cardiovascular Status Stable, Respiratory Function Stable, Patent Airway and No signs of Nausea or vomiting  Post-op Vital Signs: Reviewed and stable  Complications: No apparent anesthesia complications

## 2019-10-15 DIAGNOSIS — H2512 Age-related nuclear cataract, left eye: Secondary | ICD-10-CM | POA: Diagnosis not present

## 2019-10-15 DIAGNOSIS — M1991 Primary osteoarthritis, unspecified site: Secondary | ICD-10-CM | POA: Diagnosis not present

## 2019-10-27 ENCOUNTER — Encounter: Payer: Self-pay | Admitting: Ophthalmology

## 2019-10-27 ENCOUNTER — Other Ambulatory Visit: Payer: Self-pay

## 2019-10-30 ENCOUNTER — Other Ambulatory Visit
Admission: RE | Admit: 2019-10-30 | Discharge: 2019-10-30 | Disposition: A | Discharge status: Home / Self Care | Payer: Medicare Other | Source: Ambulatory Visit | Attending: Ophthalmology | Admitting: Ophthalmology

## 2019-10-30 DIAGNOSIS — Z20822 Contact with and (suspected) exposure to covid-19: Secondary | ICD-10-CM | POA: Diagnosis not present

## 2019-10-30 DIAGNOSIS — Z01812 Encounter for preprocedural laboratory examination: Secondary | ICD-10-CM | POA: Insufficient documentation

## 2019-10-30 LAB — SARS CORONAVIRUS 2 (TAT 6-24 HRS): SARS Coronavirus 2: NEGATIVE

## 2019-11-02 NOTE — Discharge Instructions (Signed)

## 2019-11-02 NOTE — Anesthesia Preprocedure Evaluation (Addendum)
Anesthesia Evaluation  Patient identified by MRN, date of birth, ID band Patient awake    Reviewed: Allergy & Precautions, NPO status , Patient's Chart, lab work & pertinent test results  History of Anesthesia Complications (+) PONV and history of anesthetic complications  Airway Mallampati: II  TM Distance: >3 FB Neck ROM: Full    Dental   Pulmonary    breath sounds clear to auscultation       Cardiovascular hypertension, (-) angina(-) DOE  Rhythm:Regular Rate:Normal     Neuro/Psych  Neuromuscular disease (Cubital syndrome)    GI/Hepatic neg GERD  ,  Endo/Other    Renal/GU      Musculoskeletal  (+) Arthritis ,   Abdominal (+) + obese (BMI 31),   Peds  Hematology   Anesthesia Other Findings   Reproductive/Obstetrics                            Anesthesia Physical  Anesthesia Plan  ASA: II  Anesthesia Plan: MAC   Post-op Pain Management:    Induction: Intravenous  PONV Risk Score and Plan: 2 and TIVA, Midazolam and Treatment may vary due to age or medical condition  Airway Management Planned: Natural Airway and Nasal Cannula  Additional Equipment:   Intra-op Plan:   Post-operative Plan:   Informed Consent: I have reviewed the patients History and Physical, chart, labs and discussed the procedure including the risks, benefits and alternatives for the proposed anesthesia with the patient or authorized representative who has indicated his/her understanding and acceptance.       Plan Discussed with: CRNA  Anesthesia Plan Comments:         Anesthesia Quick Evaluation

## 2019-11-03 ENCOUNTER — Ambulatory Visit
Admission: RE | Admit: 2019-11-03 | Discharge: 2019-11-03 | Disposition: A | Discharge status: Home / Self Care | Payer: Medicare Other | Attending: Ophthalmology | Admitting: Ophthalmology

## 2019-11-03 ENCOUNTER — Encounter: Payer: Self-pay | Admitting: Ophthalmology

## 2019-11-03 ENCOUNTER — Encounter
Admission: RE | Disposition: A | Discharge status: Home / Self Care | Payer: Self-pay | Source: Home / Self Care | Attending: Ophthalmology

## 2019-11-03 ENCOUNTER — Ambulatory Visit: Payer: Medicare Other | Admitting: Anesthesiology

## 2019-11-03 ENCOUNTER — Other Ambulatory Visit: Payer: Self-pay

## 2019-11-03 DIAGNOSIS — Z9841 Cataract extraction status, right eye: Secondary | ICD-10-CM | POA: Insufficient documentation

## 2019-11-03 DIAGNOSIS — Z96612 Presence of left artificial shoulder joint: Secondary | ICD-10-CM | POA: Diagnosis not present

## 2019-11-03 DIAGNOSIS — E669 Obesity, unspecified: Secondary | ICD-10-CM | POA: Diagnosis not present

## 2019-11-03 DIAGNOSIS — M1990 Unspecified osteoarthritis, unspecified site: Secondary | ICD-10-CM | POA: Insufficient documentation

## 2019-11-03 DIAGNOSIS — I10 Essential (primary) hypertension: Secondary | ICD-10-CM | POA: Diagnosis not present

## 2019-11-03 DIAGNOSIS — G5620 Lesion of ulnar nerve, unspecified upper limb: Secondary | ICD-10-CM | POA: Diagnosis not present

## 2019-11-03 DIAGNOSIS — Z9049 Acquired absence of other specified parts of digestive tract: Secondary | ICD-10-CM | POA: Insufficient documentation

## 2019-11-03 DIAGNOSIS — Z79899 Other long term (current) drug therapy: Secondary | ICD-10-CM | POA: Diagnosis not present

## 2019-11-03 DIAGNOSIS — H2512 Age-related nuclear cataract, left eye: Secondary | ICD-10-CM | POA: Diagnosis not present

## 2019-11-03 DIAGNOSIS — H25812 Combined forms of age-related cataract, left eye: Secondary | ICD-10-CM | POA: Diagnosis not present

## 2019-11-03 DIAGNOSIS — Z6831 Body mass index (BMI) 31.0-31.9, adult: Secondary | ICD-10-CM | POA: Insufficient documentation

## 2019-11-03 HISTORY — PX: CATARACT EXTRACTION W/PHACO: SHX586

## 2019-11-03 SURGERY — PHACOEMULSIFICATION, CATARACT, WITH IOL INSERTION
Anesthesia: Monitor Anesthesia Care | Site: Eye | Laterality: Left

## 2019-11-03 MED ORDER — ONDANSETRON HCL 4 MG/2ML IJ SOLN: 4.0000 mg | Freq: Once | INTRAMUSCULAR | Status: DC | PRN

## 2019-11-03 MED ORDER — LIDOCAINE HCL (PF) 2 % IJ SOLN
INTRAOCULAR | Status: DC | PRN
  Administered 2019-11-03: 1 mL

## 2019-11-03 MED ORDER — MOXIFLOXACIN HCL 0.5 % OP SOLN
OPHTHALMIC | Status: DC | PRN
  Administered 2019-11-03: 0.2 mL via OPHTHALMIC

## 2019-11-03 MED ORDER — BRIMONIDINE TARTRATE-TIMOLOL 0.2-0.5 % OP SOLN
OPHTHALMIC | Status: DC | PRN
  Administered 2019-11-03: 1 [drp] via OPHTHALMIC

## 2019-11-03 MED ORDER — ACETAMINOPHEN 10 MG/ML IV SOLN: 1000.0000 mg | Freq: Once | INTRAVENOUS | Status: DC | PRN

## 2019-11-03 MED ORDER — TETRACAINE HCL 0.5 % OP SOLN
1.0000 [drp] | OPHTHALMIC | Status: DC | PRN
  Administered 2019-11-03 (×3): 1 [drp] via OPHTHALMIC

## 2019-11-03 MED ORDER — ARMC OPHTHALMIC DILATING DROPS
1.0000 "application " | OPHTHALMIC | Status: DC | PRN
  Administered 2019-11-03 (×3): 1 via OPHTHALMIC

## 2019-11-03 MED ORDER — NA CHONDROIT SULF-NA HYALURON 40-17 MG/ML IO SOLN
INTRAOCULAR | Status: DC | PRN
  Administered 2019-11-03: 1 mL via INTRAOCULAR

## 2019-11-03 MED ORDER — MIDAZOLAM HCL 2 MG/2ML IJ SOLN
INTRAMUSCULAR | Status: DC | PRN
  Administered 2019-11-03: 2 mg via INTRAVENOUS

## 2019-11-03 MED ORDER — EPINEPHRINE PF 1 MG/ML IJ SOLN
INTRAOCULAR | Status: DC | PRN
  Administered 2019-11-03: 48 mL via OPHTHALMIC

## 2019-11-03 MED ORDER — FENTANYL CITRATE (PF) 100 MCG/2ML IJ SOLN
INTRAMUSCULAR | Status: DC | PRN
  Administered 2019-11-03: 100 ug via INTRAVENOUS

## 2019-11-03 MED ORDER — LACTATED RINGERS IV SOLN: 100.0000 mL/h | INTRAVENOUS | Status: DC

## 2019-11-03 SURGICAL SUPPLY — 20 items
CANNULA ANT/CHMB 27GA (MISCELLANEOUS) ×6 IMPLANT
DISSECTOR HYDRO NUCLEUS 50X22 (MISCELLANEOUS) ×3 IMPLANT
GLOVE SURG LX 8.0 MICRO (GLOVE) ×2
GLOVE SURG LX STRL 8.0 MICRO (GLOVE) ×1 IMPLANT
GLOVE SURG TRIUMPH 8.0 PF LTX (GLOVE) ×3 IMPLANT
GOWN STRL REUS W/ TWL LRG LVL3 (GOWN DISPOSABLE) ×2 IMPLANT
GOWN STRL REUS W/TWL LRG LVL3 (GOWN DISPOSABLE) ×4
LENS IOL ACRSF IQ VT 15 19.5 ×1 IMPLANT
LENS IOL ACRYSOF VIVITY 19.5 ×2 IMPLANT
LENS IOL VIVITY 015 19.5 ×1 IMPLANT
MARKER SKIN DUAL TIP RULER LAB (MISCELLANEOUS) ×3 IMPLANT
NEEDLE FILTER BLUNT 18X 1/2SAF (NEEDLE) ×2
NEEDLE FILTER BLUNT 18X1 1/2 (NEEDLE) ×1 IMPLANT
PACK EYE AFTER SURG (MISCELLANEOUS) ×3 IMPLANT
PACK OPTHALMIC (MISCELLANEOUS) ×3 IMPLANT
PACK PORFILIO (MISCELLANEOUS) ×3 IMPLANT
SYR 3ML LL SCALE MARK (SYRINGE) ×3 IMPLANT
SYR TB 1ML LUER SLIP (SYRINGE) ×3 IMPLANT
WATER STERILE IRR 250ML POUR (IV SOLUTION) ×3 IMPLANT
WIPE NON LINTING 3.25X3.25 (MISCELLANEOUS) ×3 IMPLANT

## 2019-11-03 NOTE — Transfer of Care (Signed)
Immediate Anesthesia Transfer of Care Note  Patient: Tyler Sharp.  Procedure(s) Performed: CATARACT EXTRACTION PHACO AND INTRAOCULAR LENS PLACEMENT (IOC) LEFT VIVITY LENS 8.72  00:43.6 (Left Eye)  Patient Location: PACU  Anesthesia Type: MAC  Level of Consciousness: awake, alert  and patient cooperative  Airway and Oxygen Therapy: Patient Spontanous Breathing and Patient connected to supplemental oxygen  Post-op Assessment: Post-op Vital signs reviewed, Patient's Cardiovascular Status Stable, Respiratory Function Stable, Patent Airway and No signs of Nausea or vomiting  Post-op Vital Signs: Reviewed and stable  Complications: No apparent anesthesia complications

## 2019-11-03 NOTE — Anesthesia Postprocedure Evaluation (Signed)
Anesthesia Post Note  Patient: Tyler Sharp.  Procedure(s) Performed: CATARACT EXTRACTION PHACO AND INTRAOCULAR LENS PLACEMENT (IOC) LEFT VIVITY LENS 8.72  00:43.6 (Left Eye)     Patient location during evaluation: PACU Anesthesia Type: MAC Level of consciousness: awake and alert Pain management: pain level controlled Vital Signs Assessment: post-procedure vital signs reviewed and stable Respiratory status: spontaneous breathing, nonlabored ventilation, respiratory function stable and patient connected to nasal cannula oxygen Cardiovascular status: stable and blood pressure returned to baseline Postop Assessment: no apparent nausea or vomiting Anesthetic complications: no    Tarae Wooden A  Chaddrick Brue

## 2019-11-03 NOTE — H&P (Signed)
All labs reviewed. Abnormal studies sent to patients PCP when indicated.  Previous H&P reviewed, patient examined, there are NO CHANGES.  Tyler Gillette Porfilio3/30/202112:01 PM

## 2019-11-03 NOTE — Op Note (Signed)
PREOPERATIVE DIAGNOSIS:  Nuclear sclerotic cataract of the left eye.   POSTOPERATIVE DIAGNOSIS:  Nuclear sclerotic cataract of the left eye.   OPERATIVE PROCEDURE:@   SURGEON:  Birder Robson, MD.   ANESTHESIA:  Anesthesiologist: Heniser, Fredric Dine, MD CRNA: Silvana Newness, CRNA  1.      Managed anesthesia care. 2.     0.58ml of Shugarcaine was instilled following the paracentesis   COMPLICATIONS:  None.   TECHNIQUE:   Stop and chop   DESCRIPTION OF PROCEDURE:  The patient was examined and consented in the preoperative holding area where the aforementioned topical anesthesia was applied to the left eye and then brought back to the Operating Room where the left eye was prepped and draped in the usual sterile ophthalmic fashion and a lid speculum was placed. A paracentesis was created with the side port blade and the anterior chamber was filled with viscoelastic. A near clear corneal incision was performed with the steel keratome. A continuous curvilinear capsulorrhexis was performed with a cystotome followed by the capsulorrhexis forceps. Hydrodissection and hydrodelineation were carried out with BSS on a blunt cannula. The lens was removed in a stop and chop  technique and the remaining cortical material was removed with the irrigation-aspiration handpiece. The capsular bag was inflated with viscoelastic and the Vivity lens was placed in the capsular bag without complication. The remaining viscoelastic was removed from the eye with the irrigation-aspiration handpiece. The wounds were hydrated. The anterior chamber was flushed with BSS and the eye was inflated to physiologic pressure. 0.76ml Vigamox was placed in the anterior chamber. The wounds were found to be water tight. The eye was dressed with Combigan. The patient was given protective glasses to wear throughout the day and a shield with which to sleep tonight. The patient was also given drops with which to begin a drop regimen today and will  follow-up with me in one day. Implant Name Type Inv. Item Serial No. Manufacturer Lot No. LRB No. Used Action  LENS IOL ACRYSOF VIVITY 19.5 - [id number]  LENS IOL ACRYSOF VIVITY 19.5 [id number] [employer]CON  Left 1 Implanted    Procedure(s): CATARACT EXTRACTION PHACO AND INTRAOCULAR LENS PLACEMENT (IOC) LEFT VIVITY LENS 8.72  00:43.6 (Left)  Electronically signed: Birder Robson 11/03/2019 12:28 PM

## 2019-11-04 ENCOUNTER — Other Ambulatory Visit: Payer: Self-pay | Admitting: Family Medicine

## 2019-11-04 ENCOUNTER — Encounter: Payer: Self-pay | Admitting: *Deleted

## 2019-12-26 IMAGING — RF DG FLUORO GUIDE NDL PLC/BX
1 series · 1 of 1 positions shown · non-contrast
Comparison: none

CLINICAL DATA: Left shoulder pain.  Prior arthroscopic surgery.

[Series 1: fluoro_iodine 2fps_bw · 0.17mm/px · 1 of 1 slices shown]
[im 1/1]
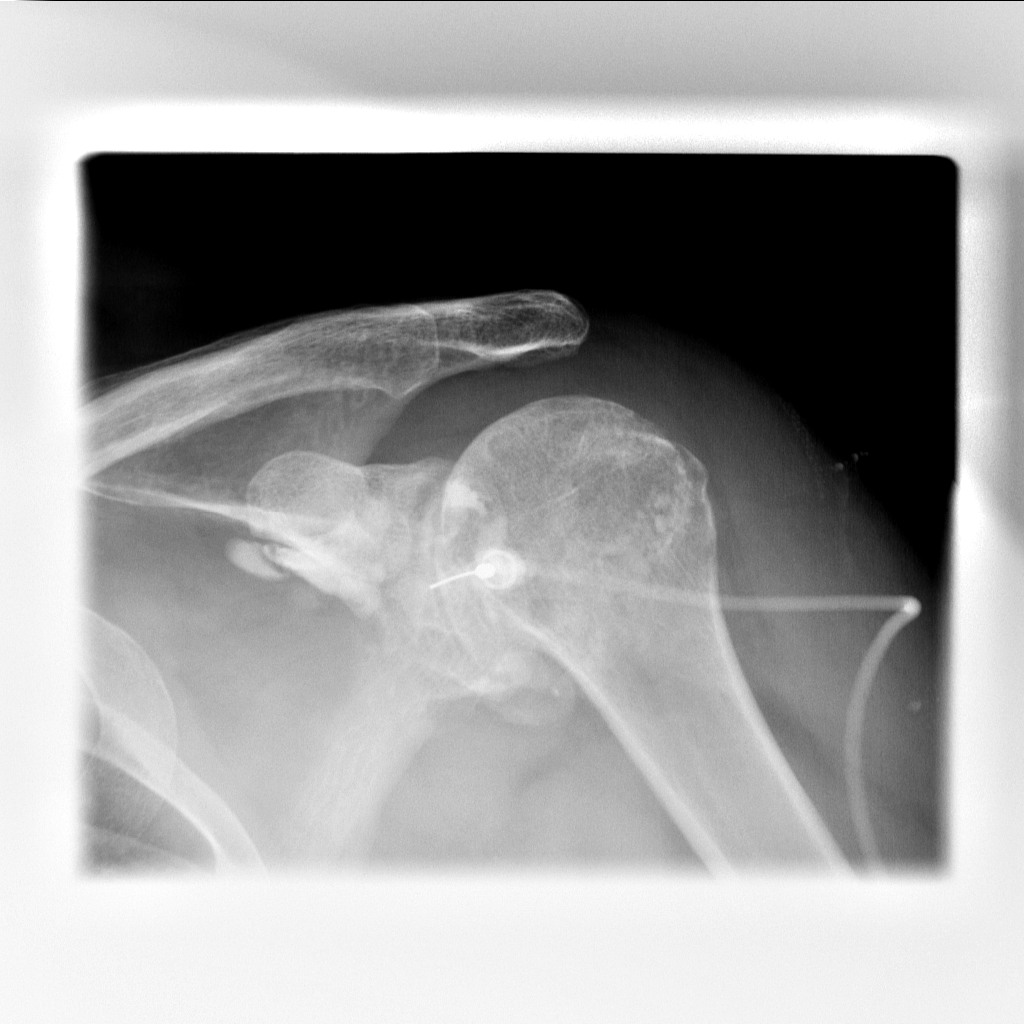

[1 of 1 positions shown; findings below may reference images not displayed]

EXAM:
LEFT SHOULDER INJECTION UNDER FLUOROSCOPY

FLUOROSCOPY TIME:  Fluoroscopy Time:  2 minutes 0 seconds

Radiation Exposure Index (if provided by the fluoroscopic device):
18.3 mGy

Number of Acquired Spot Images: 1

PROCEDURE:
After discussing the risks and benefits of this procedure with the
patient informed consent was obtained. The left shoulder sterilely
prepped and draped. Following local anesthesia with 1% lidocaine a
22 gauge needle was advanced into the left shoulder joint under
fluoroscopic guidance. Confirmation of intra-articular localization
of the needle tip was confirmed with a small amount of nonionic
contrast. This is followed by administration of 80 mg of Depo-Medrol
and 7 cc are Sensorcaine.
IMPRESSION: Successful left shoulder joint injection under fluoroscopic
guidance.

## 2020-01-13 ENCOUNTER — Ambulatory Visit: Payer: Medicare Other | Admitting: Family Medicine

## 2020-01-13 ENCOUNTER — Other Ambulatory Visit: Payer: Self-pay

## 2020-01-13 ENCOUNTER — Ambulatory Visit (INDEPENDENT_AMBULATORY_CARE_PROVIDER_SITE_OTHER): Payer: Medicare Other | Admitting: Family Medicine

## 2020-01-13 ENCOUNTER — Encounter: Payer: Self-pay | Admitting: Family Medicine

## 2020-01-13 VITALS — BP 137/86 | HR 69 | Temp 98.6°F | Wt 216.0 lb

## 2020-01-13 DIAGNOSIS — N529 Male erectile dysfunction, unspecified: Secondary | ICD-10-CM

## 2020-01-13 DIAGNOSIS — N400 Enlarged prostate without lower urinary tract symptoms: Secondary | ICD-10-CM | POA: Diagnosis not present

## 2020-01-13 DIAGNOSIS — B001 Herpesviral vesicular dermatitis: Secondary | ICD-10-CM | POA: Diagnosis not present

## 2020-01-13 DIAGNOSIS — M2550 Pain in unspecified joint: Secondary | ICD-10-CM

## 2020-01-13 DIAGNOSIS — I10 Essential (primary) hypertension: Secondary | ICD-10-CM | POA: Diagnosis not present

## 2020-01-13 MED ORDER — VALACYCLOVIR HCL 1 G PO TABS: 1000.0000 mg | ORAL_TABLET | Freq: Two times a day (BID) | ORAL | 2 refills | Status: AC | PRN

## 2020-01-13 NOTE — Progress Notes (Signed)
BP 137/86   Pulse 69   Temp 98.6 F (37 C) (Oral)   Wt 216 lb (98 kg)   SpO2 98%   BMI 30.99 kg/m    Subjective:    Patient ID: Tyler Kohler., male    DOB: Jun 07, 1951, 69 y.o.   MRN: [id number]  HPI: Tyler Funke. is a 69 y.o. male  Chief Complaint  Patient presents with  . Hypertension  . Medication Refill    valtrex   Here today for chronic condition f/u.   Still dealing with chronic joint pains. Left knee, right shoulder mostly hurting but does have diclofenac gel which helps for a while with use. Wanting to be checked for inflammatory causes of his arthritis. Denies joint redness, significant swelling, hx of inflammatory, autoimmune issues.   HTN - Home readings when checked running 130/80s range. Taking medication faithfully without side effects. Denies CP, SOB, HAs, dizziness. Eats well and stays very active.   On sildenafil prn for ED without issue, states working well for him.   On valtrex for prn cold sores, notes summertime he tends to get flares. No current lesion.   Relevant past medical, surgical, family and social history reviewed and updated as indicated. Interim medical history since our last visit reviewed. Allergies and medications reviewed and updated.  Review of Systems  Per HPI unless specifically indicated above     Objective:    BP 137/86   Pulse 69   Temp 98.6 F (37 C) (Oral)   Wt 216 lb (98 kg)   SpO2 98%   BMI 30.99 kg/m   Wt Readings from Last 3 Encounters:  01/13/20 216 lb (98 kg)  11/03/19 219 lb (99.3 kg)  10/06/19 215 lb (97.5 kg)    Physical Exam Vitals and nursing note reviewed.  Constitutional:      Appearance: Normal appearance.  HENT:     Head: Atraumatic.  Eyes:     Extraocular Movements: Extraocular movements intact.     Conjunctiva/sclera: Conjunctivae normal.  Cardiovascular:     Rate and Rhythm: Normal rate and regular rhythm.  Pulmonary:     Effort: Pulmonary effort is normal.      Breath sounds: Normal breath sounds.  Musculoskeletal:        General: No swelling or deformity. Normal range of motion.     Cervical back: Normal range of motion and neck supple.  Skin:    General: Skin is warm and dry.  Neurological:     General: No focal deficit present.     Mental Status: He is oriented to person, place, and time.  Psychiatric:        Mood and Affect: Mood normal.        Thought Content: Thought content normal.        Judgment: Judgment normal.    Results for orders placed or performed in visit on 01/13/20  Comprehensive metabolic panel  Result Value Ref Range   Glucose 116 (H) 65 - 99 mg/dL   BUN 13 8 - 27 mg/dL   Creatinine, Ser 0.93 0.76 - 1.27 mg/dL   GFR calc non Af Amer 83 >59 mL/min/1.73   GFR calc Af Amer 96 >59 mL/min/1.73   BUN/Creatinine Ratio 14 10 - 24   Sodium 139 134 - 144 mmol/L   Potassium 4.6 3.5 - 5.2 mmol/L   Chloride 101 96 - 106 mmol/L   CO2 22 20 - 29 mmol/L   Calcium 9.9 8.6 - 10.2 mg/dL  Total Protein 7.7 6.0 - 8.5 g/dL   Albumin 4.7 3.8 - 4.8 g/dL   Globulin, Total 3.0 1.5 - 4.5 g/dL   Albumin/Globulin Ratio 1.6 1.2 - 2.2   Bilirubin Total 0.6 0.0 - 1.2 mg/dL   Alkaline Phosphatase 71 48 - 121 IU/L   AST 28 0 - 40 IU/L   ALT 24 0 - 44 IU/L  PSA  Result Value Ref Range   Prostate Specific Ag, Serum 2.3 0.0 - 4.0 ng/mL  Sed Rate (ESR)  Result Value Ref Range   Sed Rate 2 0 - 30 mm/hr  C-reactive protein  Result Value Ref Range   CRP 1 0 - 10 mg/L  Uric acid  Result Value Ref Range   Uric Acid 6.4 3.8 - 8.4 mg/dL      Assessment & Plan:   Problem List Items Addressed This Visit      Cardiovascular and Mediastinum   Hypertension - Primary    BPs stable and WNL, continue current regimen      Relevant Orders   Comprehensive metabolic panel (Completed)     Genitourinary   BPH (benign prostatic hyperplasia)    Asymptomatic, recheck PSA      Relevant Orders   PSA (Completed)     Other   ED (erectile  dysfunction)    Stable and well controlled, continue current regimen       Other Visit Diagnoses    Arthralgia, unspecified joint       Relevant Orders   Sed Rate (ESR) (Completed)   C-reactive protein (Completed)   Uric acid (Completed)   Cold sore       Continue prn valtrex for flares   Relevant Medications   valACYclovir (VALTREX) 1000 MG tablet       Follow up plan: Return in about 6 months (around 07/14/2020) for 6 month f/u.

## 2020-01-14 LAB — COMPREHENSIVE METABOLIC PANEL
ALT: 24 IU/L (ref 0–44)
AST: 28 IU/L (ref 0–40)
Albumin/Globulin Ratio: 1.6 (ref 1.2–2.2)
Albumin: 4.7 g/dL (ref 3.8–4.8)
Alkaline Phosphatase: 71 IU/L (ref 48–121)
BUN/Creatinine Ratio: 14 (ref 10–24)
BUN: 13 mg/dL (ref 8–27)
Bilirubin Total: 0.6 mg/dL (ref 0.0–1.2)
CO2: 22 mmol/L (ref 20–29)
Calcium: 9.9 mg/dL (ref 8.6–10.2)
Chloride: 101 mmol/L (ref 96–106)
Creatinine, Ser: 0.93 mg/dL (ref 0.76–1.27)
GFR calc Af Amer: 96 mL/min/{1.73_m2} (ref >59)
GFR calc non Af Amer: 83 mL/min/{1.73_m2} (ref >59)
Globulin, Total: 3 g/dL (ref 1.5–4.5)
Glucose: 116 mg/dL — ABNORMAL HIGH (ref 65–99)
Potassium: 4.6 mmol/L (ref 3.5–5.2)
Sodium: 139 mmol/L (ref 134–144)
Total Protein: 7.7 g/dL (ref 6.0–8.5)

## 2020-01-14 LAB — PSA: Prostate Specific Ag, Serum: 2.3 ng/mL (ref 0.0–4.0)

## 2020-01-14 LAB — SEDIMENTATION RATE: Sed Rate: 2 mm/hr (ref 0–30)

## 2020-01-14 LAB — C-REACTIVE PROTEIN: CRP: 1 mg/L (ref 0–10)

## 2020-01-14 LAB — URIC ACID: Uric Acid: 6.4 mg/dL (ref 3.8–8.4)

## 2020-01-21 NOTE — Assessment & Plan Note (Signed)
BPs stable and WNL, continue current regimen 

## 2020-01-21 NOTE — Assessment & Plan Note (Signed)
Asymptomatic, recheck PSA

## 2020-01-21 NOTE — Assessment & Plan Note (Signed)
Stable and well controlled, continue current regimen 

## 2020-01-27 ENCOUNTER — Telehealth: Payer: Self-pay | Admitting: Family Medicine

## 2020-01-27 NOTE — Telephone Encounter (Signed)
Copied from [place] [phone]. Topic: Medicare AWV >> Jan 27, 2020  3:14 PM Cher Nakai R wrote: Reason for CRM: Left message for patient to call back and schedule the Medicare Annual Wellness Visit (AWV) virtually.  Last AWV 01/22/2019  Please schedule at anytime with [facility] Health Advisor.  45 minute appointment

## 2020-02-15 DIAGNOSIS — M25511 Pain in right shoulder: Secondary | ICD-10-CM | POA: Diagnosis not present

## 2020-02-15 DIAGNOSIS — M7541 Impingement syndrome of right shoulder: Secondary | ICD-10-CM | POA: Diagnosis not present

## 2020-02-15 DIAGNOSIS — M19011 Primary osteoarthritis, right shoulder: Secondary | ICD-10-CM | POA: Diagnosis not present

## 2020-02-22 ENCOUNTER — Encounter: Payer: Self-pay | Admitting: Family Medicine

## 2020-02-22 ENCOUNTER — Ambulatory Visit (INDEPENDENT_AMBULATORY_CARE_PROVIDER_SITE_OTHER): Payer: Medicare Other | Admitting: Family Medicine

## 2020-02-22 ENCOUNTER — Other Ambulatory Visit: Payer: Self-pay

## 2020-02-22 VITALS — BP 132/82 | HR 67 | Temp 98.1°F | Wt 215.0 lb

## 2020-02-22 DIAGNOSIS — M545 Low back pain, unspecified: Secondary | ICD-10-CM

## 2020-02-22 NOTE — Progress Notes (Signed)
BP 132/82   Pulse 67   Temp 98.1 F (36.7 C) (Oral)   Wt 215 lb (97.5 kg)   SpO2 95%   BMI 30.85 kg/m    Subjective:    Patient ID: Tyler Kohler., male    DOB: [birth year], 69 y.o.   MRN: [id number]  HPI: Tyler Yutzy. is a 69 y.o. male  Chief Complaint  Patient presents with  . Back Pain    low right side x about a week   Right flank/low back pain for about a week now. Pain is mild/moderate, intermittent, and does not appear to be related to movement, eating, bowel movements, or other triggers. Has not been trying anything OTC for pain. No associated hematuria, dysuria, injury, abdominal pain, N/V/D, fevers. Not currently having the pain.   Relevant past medical, surgical, family and social history reviewed and updated as indicated. Interim medical history since our last visit reviewed. Allergies and medications reviewed and updated.  Review of Systems  Per HPI unless specifically indicated above     Objective:    BP 132/82   Pulse 67   Temp 98.1 F (36.7 C) (Oral)   Wt 215 lb (97.5 kg)   SpO2 95%   BMI 30.85 kg/m   Wt Readings from Last 3 Encounters:  02/22/20 215 lb (97.5 kg)  01/13/20 216 lb (98 kg)  11/03/19 219 lb (99.3 kg)    Physical Exam Vitals and nursing note reviewed.  Constitutional:      Appearance: Normal appearance.  HENT:     Head: Atraumatic.  Eyes:     Extraocular Movements: Extraocular movements intact.     Conjunctiva/sclera: Conjunctivae normal.  Cardiovascular:     Rate and Rhythm: Normal rate and regular rhythm.  Pulmonary:     Effort: Pulmonary effort is normal.     Breath sounds: Normal breath sounds.  Abdominal:     General: Bowel sounds are normal. There is no distension.     Palpations: Abdomen is soft.     Tenderness: There is no abdominal tenderness. There is no right CVA tenderness, left CVA tenderness or guarding.  Musculoskeletal:        General: Normal range of motion.     Cervical back: Normal  range of motion and neck supple.  Skin:    General: Skin is warm and dry.  Neurological:     General: No focal deficit present.     Mental Status: He is oriented to person, place, and time.  Psychiatric:        Mood and Affect: Mood normal.        Thought Content: Thought content normal.        Judgment: Judgment normal.     Results for orders placed or performed in visit on 02/22/20  Comprehensive metabolic panel  Result Value Ref Range   Glucose 92 65 - 99 mg/dL   BUN 14 8 - 27 mg/dL   Creatinine, Ser 0.86 0.76 - 1.27 mg/dL   GFR calc non Af Amer 88 >59 mL/min/1.73   GFR calc Af Amer 102 >59 mL/min/1.73   BUN/Creatinine Ratio 16 10 - 24   Sodium 139 134 - 144 mmol/L   Potassium 4.7 3.5 - 5.2 mmol/L   Chloride 101 96 - 106 mmol/L   CO2 25 20 - 29 mmol/L   Calcium 9.9 8.6 - 10.2 mg/dL   Total Protein 7.7 6.0 - 8.5 g/dL   Albumin 4.9 (H) 3.8 - 4.8 g/dL  Globulin, Total 2.8 1.5 - 4.5 g/dL   Albumin/Globulin Ratio 1.8 1.2 - 2.2   Bilirubin Total 0.4 0.0 - 1.2 mg/dL   Alkaline Phosphatase 69 48 - 121 IU/L   AST 28 0 - 40 IU/L   ALT 28 0 - 44 IU/L  CBC with Differential/Platelet  Result Value Ref Range   WBC 6.6 3.4 - 10.8 x10E3/uL   RBC 4.30 4.14 - 5.80 x10E6/uL   Hemoglobin 13.5 13.0 - 17.7 g/dL   Hematocrit 41.1 37.5 - 51.0 %   MCV 96 79 - 97 fL   MCH 31.4 26.6 - 33.0 pg   MCHC 32.8 31 - 35 g/dL   RDW 12.9 11.6 - 15.4 %   Platelets 223 150 - 450 x10E3/uL   Neutrophils 55 Not Estab. %   Lymphs 33 Not Estab. %   Monocytes 8 Not Estab. %   Eos 3 Not Estab. %   Basos 1 Not Estab. %   Neutrophils Absolute 3.6 1 - 7 x10E3/uL   Lymphocytes Absolute 2.2 0 - 3 x10E3/uL   Monocytes Absolute 0.5 0 - 0 x10E3/uL   EOS (ABSOLUTE) 0.2 0.0 - 0.4 x10E3/uL   Basophils Absolute 0.1 0 - 0 x10E3/uL   Immature Granulocytes 0 Not Estab. %   Immature Grans (Abs) 0.0 0.0 - 0.1 x10E3/uL      Assessment & Plan:   Problem List Items Addressed This Visit    None    Visit Diagnoses     Right low back pain, unspecified chronicity, unspecified whether sciatica present    -  Primary   Possible small kidney stone vs mild muscle strain, U/A WNL and exam, vitals benign. Push fluids, rest, continue to monitor. F/u if worsening or not improving   Relevant Orders   UA/M w/rflx Culture, Routine   Comprehensive metabolic panel (Completed)   CBC with Differential/Platelet (Completed)       Follow up plan: Return for as scheduled.

## 2020-02-23 ENCOUNTER — Encounter: Payer: Self-pay | Admitting: Family Medicine

## 2020-02-23 LAB — CBC WITH DIFFERENTIAL/PLATELET
Basophils Absolute: 0.1 10*3/uL (ref 0.0–0.2)
Basos: 1 %
EOS (ABSOLUTE): 0.2 10*3/uL (ref 0.0–0.4)
Eos: 3 %
Hematocrit: 41.1 % (ref 37.5–51.0)
Hemoglobin: 13.5 g/dL (ref 13.0–17.7)
Immature Grans (Abs): 0 10*3/uL (ref 0.0–0.1)
Immature Granulocytes: 0 %
Lymphocytes Absolute: 2.2 10*3/uL (ref 0.7–3.1)
Lymphs: 33 %
MCH: 31.4 pg (ref 26.6–33.0)
MCHC: 32.8 g/dL (ref 31.5–35.7)
MCV: 96 fL (ref 79–97)
Monocytes Absolute: 0.5 10*3/uL (ref 0.1–0.9)
Monocytes: 8 %
Neutrophils Absolute: 3.6 10*3/uL (ref 1.4–7.0)
Neutrophils: 55 %
Platelets: 223 10*3/uL (ref 150–450)
RBC: 4.3 x10E6/uL (ref 4.14–5.80)
RDW: 12.9 % (ref 11.6–15.4)
WBC: 6.6 10*3/uL (ref 3.4–10.8)

## 2020-02-23 LAB — COMPREHENSIVE METABOLIC PANEL
ALT: 28 IU/L (ref 0–44)
AST: 28 IU/L (ref 0–40)
Albumin/Globulin Ratio: 1.8 (ref 1.2–2.2)
Albumin: 4.9 g/dL — ABNORMAL HIGH (ref 3.8–4.8)
Alkaline Phosphatase: 69 IU/L (ref 48–121)
BUN/Creatinine Ratio: 16 (ref 10–24)
BUN: 14 mg/dL (ref 8–27)
Bilirubin Total: 0.4 mg/dL (ref 0.0–1.2)
CO2: 25 mmol/L (ref 20–29)
Calcium: 9.9 mg/dL (ref 8.6–10.2)
Chloride: 101 mmol/L (ref 96–106)
Creatinine, Ser: 0.86 mg/dL (ref 0.76–1.27)
GFR calc Af Amer: 102 mL/min/{1.73_m2} (ref >59)
GFR calc non Af Amer: 88 mL/min/{1.73_m2} (ref >59)
Globulin, Total: 2.8 g/dL (ref 1.5–4.5)
Glucose: 92 mg/dL (ref 65–99)
Potassium: 4.7 mmol/L (ref 3.5–5.2)
Sodium: 139 mmol/L (ref 134–144)
Total Protein: 7.7 g/dL (ref 6.0–8.5)

## 2020-02-23 LAB — UA/M W/RFLX CULTURE, ROUTINE
Bilirubin, UA: NEGATIVE
Glucose, UA: NEGATIVE
Ketones, UA: NEGATIVE
Leukocytes,UA: NEGATIVE
Nitrite, UA: NEGATIVE
Protein,UA: NEGATIVE
RBC, UA: NEGATIVE
Specific Gravity, UA: 1.01 (ref 1.005–1.030)
Urobilinogen, Ur: 0.2 mg/dL (ref 0.2–1.0)
pH, UA: 6.5 (ref 5.0–7.5)

## 2020-02-25 ENCOUNTER — Other Ambulatory Visit: Payer: Self-pay | Admitting: Orthopedic Surgery

## 2020-02-25 DIAGNOSIS — M7541 Impingement syndrome of right shoulder: Secondary | ICD-10-CM

## 2020-02-26 ENCOUNTER — Telehealth: Payer: Self-pay | Admitting: Family Medicine

## 2020-02-26 ENCOUNTER — Telehealth: Payer: Self-pay

## 2020-02-26 NOTE — Telephone Encounter (Signed)
See other phone encounter.  

## 2020-02-26 NOTE — Telephone Encounter (Signed)
Tried calling patient back again. No answer, left another VM. Please give patient the following message if he calls back. It is on the letter he will be getting from [place].  "Below are the results from your recent visit: your labs came back stable. Take care and let us know if your pain worsens or does not resolve."

## 2020-02-26 NOTE — Telephone Encounter (Signed)
Copied from [place] [phone]. Topic: Quick Communication - Lab Results (Clinic Use ONLY) >> Feb 26, 2020  8:10 AM Lennox Solders wrote: Pt is calling and would like blood work results from 02-22-2020   Called and LVM asking for patient to please return my call. Letter sent to patient with results 02/23/20. Will read result message to patient if he calls back.

## 2020-02-26 NOTE — Telephone Encounter (Signed)
Patient is returning a call regarding his blood work results.  Tried to get NT, but they could not find the letter that was sent to patient.  Patient would like a call back to discuss results.  CB# [phone]

## 2020-02-26 NOTE — Telephone Encounter (Signed)
Pt called back results were given . Pt expressed understanding of results with no further questions .

## 2020-03-15 ENCOUNTER — Encounter: Payer: Self-pay | Admitting: Family Medicine

## 2020-03-29 ENCOUNTER — Telehealth: Payer: Self-pay | Admitting: Family Medicine

## 2020-03-29 NOTE — Telephone Encounter (Signed)
Copied from [place] [id number]. Topic: General - Other >> Mar 29, 2020  9:12 AM Keene Breath wrote: Reason for CRM: Patient stated that the doctor told him to call back if he was still having pain in his kidney area.  Patient stated he is and would like to know what the doctor recommends.  Please call patient at [phone]

## 2020-03-30 NOTE — Telephone Encounter (Signed)
I think it's reasonable at this point to do a CT scan to see if there's a kidney stone

## 2020-03-31 NOTE — Telephone Encounter (Signed)
Pt stated that the pain comes and goes. He stated that he will wait a few weeks to see if he notices any changes and he will call back if needed.

## 2020-04-22 ENCOUNTER — Ambulatory Visit: Payer: Medicare Other

## 2020-04-27 ENCOUNTER — Other Ambulatory Visit: Payer: Self-pay | Admitting: Family Medicine

## 2020-04-27 NOTE — Telephone Encounter (Signed)
Pt has appt 07/20/20 with Marnee Guarneri

## 2020-05-16 ENCOUNTER — Other Ambulatory Visit: Payer: Self-pay | Admitting: Internal Medicine

## 2020-05-16 DIAGNOSIS — E785 Hyperlipidemia, unspecified: Secondary | ICD-10-CM | POA: Diagnosis not present

## 2020-05-16 DIAGNOSIS — I10 Essential (primary) hypertension: Secondary | ICD-10-CM | POA: Diagnosis not present

## 2020-05-16 DIAGNOSIS — N419 Inflammatory disease of prostate, unspecified: Secondary | ICD-10-CM | POA: Diagnosis not present

## 2020-05-16 DIAGNOSIS — N410 Acute prostatitis: Secondary | ICD-10-CM | POA: Diagnosis not present

## 2020-05-16 DIAGNOSIS — M545 Low back pain, unspecified: Secondary | ICD-10-CM | POA: Diagnosis not present

## 2020-06-25 ENCOUNTER — Other Ambulatory Visit: Payer: Self-pay | Admitting: Nurse Practitioner

## 2020-06-25 ENCOUNTER — Other Ambulatory Visit: Payer: Self-pay | Admitting: Family Medicine

## 2020-07-07 DIAGNOSIS — H26492 Other secondary cataract, left eye: Secondary | ICD-10-CM | POA: Diagnosis not present

## 2020-07-20 ENCOUNTER — Ambulatory Visit: Payer: Medicare Other | Admitting: Family Medicine

## 2020-07-20 ENCOUNTER — Ambulatory Visit: Payer: Medicare Other | Admitting: Nurse Practitioner

## 2020-07-20 ENCOUNTER — Other Ambulatory Visit: Payer: Self-pay

## 2020-07-20 DIAGNOSIS — M1711 Unilateral primary osteoarthritis, right knee: Secondary | ICD-10-CM | POA: Diagnosis not present

## 2020-08-09 DIAGNOSIS — I10 Essential (primary) hypertension: Secondary | ICD-10-CM | POA: Diagnosis not present

## 2020-08-09 DIAGNOSIS — Z20822 Contact with and (suspected) exposure to covid-19: Secondary | ICD-10-CM | POA: Diagnosis not present

## 2020-08-09 DIAGNOSIS — E785 Hyperlipidemia, unspecified: Secondary | ICD-10-CM | POA: Diagnosis not present

## 2020-08-09 DIAGNOSIS — N410 Acute prostatitis: Secondary | ICD-10-CM | POA: Diagnosis not present

## 2020-08-11 ENCOUNTER — Other Ambulatory Visit: Payer: Self-pay

## 2020-08-11 ENCOUNTER — Ambulatory Visit
Admission: RE | Admit: 2020-08-11 | Discharge: 2020-08-11 | Disposition: A | Discharge status: Home / Self Care | Payer: Medicare Other | Source: Ambulatory Visit | Attending: Orthopedic Surgery | Admitting: Orthopedic Surgery

## 2020-08-11 DIAGNOSIS — M7541 Impingement syndrome of right shoulder: Secondary | ICD-10-CM | POA: Diagnosis not present

## 2020-08-16 ENCOUNTER — Other Ambulatory Visit: Payer: Self-pay | Admitting: Physician Assistant

## 2020-08-16 DIAGNOSIS — Z125 Encounter for screening for malignant neoplasm of prostate: Secondary | ICD-10-CM | POA: Diagnosis not present

## 2020-08-16 DIAGNOSIS — I10 Essential (primary) hypertension: Secondary | ICD-10-CM | POA: Diagnosis not present

## 2020-08-16 DIAGNOSIS — M19019 Primary osteoarthritis, unspecified shoulder: Secondary | ICD-10-CM | POA: Diagnosis not present

## 2020-08-16 DIAGNOSIS — E785 Hyperlipidemia, unspecified: Secondary | ICD-10-CM | POA: Diagnosis not present

## 2020-08-16 DIAGNOSIS — R7989 Other specified abnormal findings of blood chemistry: Secondary | ICD-10-CM | POA: Diagnosis not present

## 2020-08-16 DIAGNOSIS — E039 Hypothyroidism, unspecified: Secondary | ICD-10-CM | POA: Diagnosis not present

## 2020-08-16 DIAGNOSIS — Z791 Long term (current) use of non-steroidal anti-inflammatories (NSAID): Secondary | ICD-10-CM | POA: Diagnosis not present

## 2020-08-31 DIAGNOSIS — Z01812 Encounter for preprocedural laboratory examination: Secondary | ICD-10-CM | POA: Diagnosis not present

## 2020-08-31 DIAGNOSIS — M75121 Complete rotator cuff tear or rupture of right shoulder, not specified as traumatic: Secondary | ICD-10-CM | POA: Diagnosis not present

## 2020-08-31 DIAGNOSIS — M19011 Primary osteoarthritis, right shoulder: Secondary | ICD-10-CM | POA: Diagnosis not present

## 2020-08-31 DIAGNOSIS — M19012 Primary osteoarthritis, left shoulder: Secondary | ICD-10-CM | POA: Diagnosis not present

## 2020-09-02 DIAGNOSIS — I10 Essential (primary) hypertension: Secondary | ICD-10-CM | POA: Diagnosis not present

## 2020-09-02 DIAGNOSIS — E039 Hypothyroidism, unspecified: Secondary | ICD-10-CM | POA: Diagnosis not present

## 2020-09-02 DIAGNOSIS — M75101 Unspecified rotator cuff tear or rupture of right shoulder, not specified as traumatic: Secondary | ICD-10-CM | POA: Diagnosis not present

## 2020-09-02 DIAGNOSIS — E669 Obesity, unspecified: Secondary | ICD-10-CM | POA: Diagnosis not present

## 2020-09-02 DIAGNOSIS — N400 Enlarged prostate without lower urinary tract symptoms: Secondary | ICD-10-CM | POA: Diagnosis not present

## 2020-09-02 DIAGNOSIS — M25511 Pain in right shoulder: Secondary | ICD-10-CM | POA: Diagnosis not present

## 2020-09-02 DIAGNOSIS — Z791 Long term (current) use of non-steroidal anti-inflammatories (NSAID): Secondary | ICD-10-CM | POA: Diagnosis not present

## 2020-09-02 DIAGNOSIS — Z96611 Presence of right artificial shoulder joint: Secondary | ICD-10-CM | POA: Diagnosis not present

## 2020-09-02 DIAGNOSIS — G8918 Other acute postprocedural pain: Secondary | ICD-10-CM | POA: Diagnosis not present

## 2020-09-02 DIAGNOSIS — Z885 Allergy status to narcotic agent status: Secondary | ICD-10-CM | POA: Diagnosis not present

## 2020-09-02 DIAGNOSIS — M19011 Primary osteoarthritis, right shoulder: Secondary | ICD-10-CM | POA: Diagnosis not present

## 2020-09-02 DIAGNOSIS — E785 Hyperlipidemia, unspecified: Secondary | ICD-10-CM | POA: Diagnosis not present

## 2020-09-02 DIAGNOSIS — Z9849 Cataract extraction status, unspecified eye: Secondary | ICD-10-CM | POA: Diagnosis not present

## 2020-09-02 DIAGNOSIS — Z961 Presence of intraocular lens: Secondary | ICD-10-CM | POA: Diagnosis not present

## 2020-09-02 DIAGNOSIS — Z6830 Body mass index (BMI) 30.0-30.9, adult: Secondary | ICD-10-CM | POA: Diagnosis not present

## 2020-09-02 DIAGNOSIS — M12811 Other specific arthropathies, not elsewhere classified, right shoulder: Secondary | ICD-10-CM | POA: Diagnosis not present

## 2020-09-02 DIAGNOSIS — M75111 Incomplete rotator cuff tear or rupture of right shoulder, not specified as traumatic: Secondary | ICD-10-CM | POA: Diagnosis not present

## 2020-09-03 DIAGNOSIS — I10 Essential (primary) hypertension: Secondary | ICD-10-CM | POA: Diagnosis not present

## 2020-09-03 DIAGNOSIS — E785 Hyperlipidemia, unspecified: Secondary | ICD-10-CM | POA: Diagnosis not present

## 2020-09-03 DIAGNOSIS — M19011 Primary osteoarthritis, right shoulder: Secondary | ICD-10-CM | POA: Diagnosis not present

## 2020-09-03 DIAGNOSIS — E039 Hypothyroidism, unspecified: Secondary | ICD-10-CM | POA: Diagnosis not present

## 2020-09-03 DIAGNOSIS — N400 Enlarged prostate without lower urinary tract symptoms: Secondary | ICD-10-CM | POA: Diagnosis not present

## 2020-09-03 DIAGNOSIS — M75101 Unspecified rotator cuff tear or rupture of right shoulder, not specified as traumatic: Secondary | ICD-10-CM | POA: Diagnosis not present

## 2020-09-07 ENCOUNTER — Other Ambulatory Visit: Payer: Self-pay | Admitting: Nurse Practitioner

## 2020-09-07 NOTE — Telephone Encounter (Signed)
   Notes to clinic Please assess if pt is still associated with your practice, new PCP listed.

## 2020-09-08 NOTE — Telephone Encounter (Signed)
No longer our patient

## 2020-09-08 NOTE — Telephone Encounter (Signed)
   Notes to clinic Not a provider we approve rx for.  

## 2020-09-12 DIAGNOSIS — M19011 Primary osteoarthritis, right shoulder: Secondary | ICD-10-CM | POA: Diagnosis not present

## 2020-09-12 DIAGNOSIS — Z96611 Presence of right artificial shoulder joint: Secondary | ICD-10-CM | POA: Diagnosis not present

## 2020-09-22 DIAGNOSIS — M25611 Stiffness of right shoulder, not elsewhere classified: Secondary | ICD-10-CM | POA: Diagnosis not present

## 2020-09-22 DIAGNOSIS — M25511 Pain in right shoulder: Secondary | ICD-10-CM | POA: Diagnosis not present

## 2020-09-27 DIAGNOSIS — M25611 Stiffness of right shoulder, not elsewhere classified: Secondary | ICD-10-CM | POA: Diagnosis not present

## 2020-09-27 DIAGNOSIS — M25511 Pain in right shoulder: Secondary | ICD-10-CM | POA: Diagnosis not present

## 2020-09-29 DIAGNOSIS — M25511 Pain in right shoulder: Secondary | ICD-10-CM | POA: Diagnosis not present

## 2020-09-29 DIAGNOSIS — M25611 Stiffness of right shoulder, not elsewhere classified: Secondary | ICD-10-CM | POA: Diagnosis not present

## 2020-10-06 DIAGNOSIS — M25611 Stiffness of right shoulder, not elsewhere classified: Secondary | ICD-10-CM | POA: Diagnosis not present

## 2020-10-06 DIAGNOSIS — M25511 Pain in right shoulder: Secondary | ICD-10-CM | POA: Diagnosis not present

## 2020-10-10 DIAGNOSIS — M25511 Pain in right shoulder: Secondary | ICD-10-CM | POA: Diagnosis not present

## 2020-10-10 DIAGNOSIS — M25611 Stiffness of right shoulder, not elsewhere classified: Secondary | ICD-10-CM | POA: Diagnosis not present

## 2020-10-12 ENCOUNTER — Other Ambulatory Visit: Payer: Self-pay | Admitting: Internal Medicine

## 2020-10-12 DIAGNOSIS — R7989 Other specified abnormal findings of blood chemistry: Secondary | ICD-10-CM | POA: Diagnosis not present

## 2020-10-12 DIAGNOSIS — E039 Hypothyroidism, unspecified: Secondary | ICD-10-CM | POA: Insufficient documentation

## 2020-10-13 DIAGNOSIS — M25511 Pain in right shoulder: Secondary | ICD-10-CM | POA: Diagnosis not present

## 2020-10-13 DIAGNOSIS — M25611 Stiffness of right shoulder, not elsewhere classified: Secondary | ICD-10-CM | POA: Diagnosis not present

## 2020-10-17 DIAGNOSIS — M25511 Pain in right shoulder: Secondary | ICD-10-CM | POA: Diagnosis not present

## 2020-10-17 DIAGNOSIS — M25611 Stiffness of right shoulder, not elsewhere classified: Secondary | ICD-10-CM | POA: Diagnosis not present

## 2020-10-19 DIAGNOSIS — M25611 Stiffness of right shoulder, not elsewhere classified: Secondary | ICD-10-CM | POA: Diagnosis not present

## 2020-10-19 DIAGNOSIS — M25511 Pain in right shoulder: Secondary | ICD-10-CM | POA: Diagnosis not present

## 2020-10-24 DIAGNOSIS — Z96611 Presence of right artificial shoulder joint: Secondary | ICD-10-CM | POA: Diagnosis not present

## 2020-10-24 DIAGNOSIS — M19011 Primary osteoarthritis, right shoulder: Secondary | ICD-10-CM | POA: Diagnosis not present

## 2020-10-26 DIAGNOSIS — M25511 Pain in right shoulder: Secondary | ICD-10-CM | POA: Diagnosis not present

## 2020-10-26 DIAGNOSIS — M25611 Stiffness of right shoulder, not elsewhere classified: Secondary | ICD-10-CM | POA: Diagnosis not present

## 2020-10-31 DIAGNOSIS — M25611 Stiffness of right shoulder, not elsewhere classified: Secondary | ICD-10-CM | POA: Diagnosis not present

## 2020-10-31 DIAGNOSIS — M25511 Pain in right shoulder: Secondary | ICD-10-CM | POA: Diagnosis not present

## 2020-11-02 DIAGNOSIS — M25611 Stiffness of right shoulder, not elsewhere classified: Secondary | ICD-10-CM | POA: Diagnosis not present

## 2020-11-02 DIAGNOSIS — M25511 Pain in right shoulder: Secondary | ICD-10-CM | POA: Diagnosis not present

## 2020-11-07 DIAGNOSIS — M25511 Pain in right shoulder: Secondary | ICD-10-CM | POA: Diagnosis not present

## 2020-11-07 DIAGNOSIS — M25611 Stiffness of right shoulder, not elsewhere classified: Secondary | ICD-10-CM | POA: Diagnosis not present

## 2020-11-09 DIAGNOSIS — M25611 Stiffness of right shoulder, not elsewhere classified: Secondary | ICD-10-CM | POA: Diagnosis not present

## 2020-11-09 DIAGNOSIS — M25511 Pain in right shoulder: Secondary | ICD-10-CM | POA: Diagnosis not present

## 2020-11-10 ENCOUNTER — Other Ambulatory Visit: Payer: Self-pay

## 2020-11-10 MED FILL — Levothyroxine Sodium Tab 25 MCG: ORAL | 3 days supply | Qty: 3 | Fill #0 | Status: CN

## 2020-11-10 MED FILL — Levothyroxine Sodium Tab 25 MCG: ORAL | 10 days supply | Qty: 10 | Fill #0 | Status: CN

## 2020-11-10 MED FILL — Levothyroxine Sodium Tab 25 MCG: ORAL | 30 days supply | Qty: 30 | Fill #0 | Status: CN

## 2020-11-14 DIAGNOSIS — E039 Hypothyroidism, unspecified: Secondary | ICD-10-CM | POA: Diagnosis not present

## 2020-11-28 DIAGNOSIS — M25511 Pain in right shoulder: Secondary | ICD-10-CM | POA: Diagnosis not present

## 2020-11-28 DIAGNOSIS — M25611 Stiffness of right shoulder, not elsewhere classified: Secondary | ICD-10-CM | POA: Diagnosis not present

## 2020-12-01 DIAGNOSIS — M25511 Pain in right shoulder: Secondary | ICD-10-CM | POA: Diagnosis not present

## 2020-12-01 DIAGNOSIS — M25611 Stiffness of right shoulder, not elsewhere classified: Secondary | ICD-10-CM | POA: Diagnosis not present

## 2020-12-05 DIAGNOSIS — M25611 Stiffness of right shoulder, not elsewhere classified: Secondary | ICD-10-CM | POA: Diagnosis not present

## 2020-12-05 DIAGNOSIS — M25511 Pain in right shoulder: Secondary | ICD-10-CM | POA: Diagnosis not present

## 2020-12-08 DIAGNOSIS — M25611 Stiffness of right shoulder, not elsewhere classified: Secondary | ICD-10-CM | POA: Diagnosis not present

## 2020-12-08 DIAGNOSIS — M25511 Pain in right shoulder: Secondary | ICD-10-CM | POA: Diagnosis not present

## 2020-12-12 DIAGNOSIS — M25511 Pain in right shoulder: Secondary | ICD-10-CM | POA: Diagnosis not present

## 2020-12-22 DIAGNOSIS — X32XXXA Exposure to sunlight, initial encounter: Secondary | ICD-10-CM | POA: Diagnosis not present

## 2020-12-22 DIAGNOSIS — D2262 Melanocytic nevi of left upper limb, including shoulder: Secondary | ICD-10-CM | POA: Diagnosis not present

## 2020-12-22 DIAGNOSIS — D2261 Melanocytic nevi of right upper limb, including shoulder: Secondary | ICD-10-CM | POA: Diagnosis not present

## 2020-12-22 DIAGNOSIS — Z85828 Personal history of other malignant neoplasm of skin: Secondary | ICD-10-CM | POA: Diagnosis not present

## 2020-12-22 DIAGNOSIS — L570 Actinic keratosis: Secondary | ICD-10-CM | POA: Diagnosis not present

## 2020-12-22 DIAGNOSIS — D2272 Melanocytic nevi of left lower limb, including hip: Secondary | ICD-10-CM | POA: Diagnosis not present

## 2021-02-14 DIAGNOSIS — Z20822 Contact with and (suspected) exposure to covid-19: Secondary | ICD-10-CM | POA: Diagnosis not present

## 2021-02-14 DIAGNOSIS — E785 Hyperlipidemia, unspecified: Secondary | ICD-10-CM | POA: Diagnosis not present

## 2021-02-14 DIAGNOSIS — M19019 Primary osteoarthritis, unspecified shoulder: Secondary | ICD-10-CM | POA: Diagnosis not present

## 2021-02-14 DIAGNOSIS — I10 Essential (primary) hypertension: Secondary | ICD-10-CM | POA: Diagnosis not present

## 2021-02-14 DIAGNOSIS — Z791 Long term (current) use of non-steroidal anti-inflammatories (NSAID): Secondary | ICD-10-CM | POA: Diagnosis not present

## 2021-02-14 DIAGNOSIS — Z125 Encounter for screening for malignant neoplasm of prostate: Secondary | ICD-10-CM | POA: Diagnosis not present

## 2021-02-22 ENCOUNTER — Other Ambulatory Visit: Payer: Self-pay

## 2021-02-22 DIAGNOSIS — M79671 Pain in right foot: Secondary | ICD-10-CM | POA: Diagnosis not present

## 2021-02-22 DIAGNOSIS — M19071 Primary osteoarthritis, right ankle and foot: Secondary | ICD-10-CM | POA: Diagnosis not present

## 2021-02-22 DIAGNOSIS — M2011 Hallux valgus (acquired), right foot: Secondary | ICD-10-CM | POA: Diagnosis not present

## 2021-02-22 DIAGNOSIS — M2010 Hallux valgus (acquired), unspecified foot: Secondary | ICD-10-CM | POA: Insufficient documentation

## 2021-02-22 MED ORDER — METHYLPREDNISOLONE 4 MG PO TBPK
ORAL_TABLET | ORAL | 0 refills | Status: DC
  Filled 2021-02-22: qty 21, 6d supply, fill #0

## 2021-03-02 ENCOUNTER — Other Ambulatory Visit: Payer: Self-pay

## 2021-03-02 DIAGNOSIS — M19019 Primary osteoarthritis, unspecified shoulder: Secondary | ICD-10-CM | POA: Diagnosis not present

## 2021-03-02 DIAGNOSIS — Z23 Encounter for immunization: Secondary | ICD-10-CM | POA: Diagnosis not present

## 2021-03-02 DIAGNOSIS — E039 Hypothyroidism, unspecified: Secondary | ICD-10-CM | POA: Diagnosis not present

## 2021-03-02 DIAGNOSIS — I10 Essential (primary) hypertension: Secondary | ICD-10-CM | POA: Diagnosis not present

## 2021-03-02 DIAGNOSIS — D649 Anemia, unspecified: Secondary | ICD-10-CM | POA: Diagnosis not present

## 2021-03-02 DIAGNOSIS — Z0000 Encounter for general adult medical examination without abnormal findings: Secondary | ICD-10-CM | POA: Diagnosis not present

## 2021-03-02 MED ORDER — LEVOTHYROXINE SODIUM 50 MCG PO TABS
ORAL_TABLET | ORAL | 3 refills | Status: DC
  Filled 2021-03-02 – 2021-04-18 (×2): qty 90, 90d supply, fill #0

## 2021-03-14 ENCOUNTER — Other Ambulatory Visit: Payer: Self-pay

## 2021-03-14 DIAGNOSIS — L570 Actinic keratosis: Secondary | ICD-10-CM | POA: Diagnosis not present

## 2021-03-14 DIAGNOSIS — C44519 Basal cell carcinoma of skin of other part of trunk: Secondary | ICD-10-CM | POA: Diagnosis not present

## 2021-03-14 DIAGNOSIS — D485 Neoplasm of uncertain behavior of skin: Secondary | ICD-10-CM | POA: Diagnosis not present

## 2021-03-14 DIAGNOSIS — L814 Other melanin hyperpigmentation: Secondary | ICD-10-CM | POA: Diagnosis not present

## 2021-03-28 DIAGNOSIS — C44519 Basal cell carcinoma of skin of other part of trunk: Secondary | ICD-10-CM | POA: Diagnosis not present

## 2021-04-05 DIAGNOSIS — E039 Hypothyroidism, unspecified: Secondary | ICD-10-CM | POA: Diagnosis not present

## 2021-04-18 ENCOUNTER — Other Ambulatory Visit: Payer: Self-pay

## 2021-05-15 DIAGNOSIS — E039 Hypothyroidism, unspecified: Secondary | ICD-10-CM | POA: Diagnosis not present

## 2021-06-15 DIAGNOSIS — H26492 Other secondary cataract, left eye: Secondary | ICD-10-CM | POA: Diagnosis not present

## 2021-06-20 DIAGNOSIS — Q667 Congenital pes cavus, unspecified foot: Secondary | ICD-10-CM | POA: Diagnosis not present

## 2021-06-20 DIAGNOSIS — M2011 Hallux valgus (acquired), right foot: Secondary | ICD-10-CM | POA: Diagnosis not present

## 2021-06-20 DIAGNOSIS — M79671 Pain in right foot: Secondary | ICD-10-CM | POA: Diagnosis not present

## 2021-06-20 DIAGNOSIS — M2012 Hallux valgus (acquired), left foot: Secondary | ICD-10-CM | POA: Diagnosis not present

## 2021-06-20 DIAGNOSIS — M19071 Primary osteoarthritis, right ankle and foot: Secondary | ICD-10-CM | POA: Diagnosis not present

## 2021-07-06 ENCOUNTER — Other Ambulatory Visit: Payer: Self-pay

## 2021-07-06 DIAGNOSIS — B301 Conjunctivitis due to adenovirus: Secondary | ICD-10-CM | POA: Diagnosis not present

## 2021-07-06 MED ORDER — NEOMYCIN-POLYMYXIN-DEXAMETH 0.1 % OP SUSP
OPHTHALMIC | 0 refills | Status: DC
  Filled 2021-07-07: qty 5, 10d supply, fill #0

## 2021-07-07 ENCOUNTER — Other Ambulatory Visit: Payer: Self-pay

## 2021-09-07 ENCOUNTER — Other Ambulatory Visit: Payer: Self-pay

## 2021-09-07 MED ORDER — AMOXICILLIN 500 MG PO CAPS
2000.0000 mg | ORAL_CAPSULE | Freq: Once | ORAL | 0 refills | Status: AC
  Filled 2021-09-07 – 2022-03-27 (×2): qty 4, 1d supply, fill #0

## 2021-09-15 ENCOUNTER — Other Ambulatory Visit: Payer: Self-pay

## 2021-11-27 ENCOUNTER — Other Ambulatory Visit: Payer: Self-pay

## 2021-11-27 MED ORDER — TRAMADOL HCL 50 MG PO TABS
ORAL_TABLET | ORAL | 0 refills | Status: DC
  Filled 2021-11-27: qty 28, 7d supply, fill #0
  Filled 2022-05-22: qty 14, 4d supply, fill #1

## 2021-11-27 MED ORDER — MELOXICAM 15 MG PO TABS
ORAL_TABLET | ORAL | 1 refills | Status: DC
  Filled 2021-11-27: qty 60, 60d supply, fill #0

## 2021-11-27 MED ORDER — METHYLPREDNISOLONE 4 MG PO TBPK
ORAL_TABLET | ORAL | 0 refills | Status: DC
  Filled 2021-11-27: qty 21, 6d supply, fill #0

## 2022-03-27 ENCOUNTER — Other Ambulatory Visit: Payer: Self-pay

## 2022-05-22 ENCOUNTER — Other Ambulatory Visit: Payer: Self-pay

## 2022-06-05 ENCOUNTER — Other Ambulatory Visit: Payer: Self-pay

## 2022-06-12 ENCOUNTER — Other Ambulatory Visit: Payer: Self-pay

## 2022-06-12 ENCOUNTER — Other Ambulatory Visit: Payer: Self-pay | Admitting: General Surgery

## 2022-06-12 MED ORDER — POLYETHYLENE GLYCOL 3350 17 GM/SCOOP PO POWD: ORAL | 0 refills | Status: DC

## 2022-06-12 NOTE — Progress Notes (Signed)
Mayer Masker, MD - 06/12/2022 10:00 AM EST Formatting of this note is different from the original. Subjective:  Patient ID: Tyler Sharp is a 71 y.o. male.  HPI  The following portions of the patient's history were reviewed and updated as appropriate.  This is a return patient is here today for: office visit. Patient has been referred by Dr. Ramonita Lab to discuss having a colonoscopy. Patient reports he has bowel movements once per day. He states recently he has had bright red blood on the toilet tissue occasionally with bowel movements. Patient believes he may have a fissure or hemorrhoids. He denies any mucus.  Chief Complaint Patient presents with Pre-op Exam   BP 126/82  Pulse 68  Temp 36.7 C (98.1 F)  Ht 174 cm (5' 8.5")  Wt 100.7 kg (222 lb)  SpO2 98%  BMI 33.26 kg/m  Past Medical History: Diagnosis Date Arthritis BPH (benign prostatic hyperplasia) 05/16/2017 Hypertension 05/15/2020 PONV (postoperative nausea and vomiting)   Past Surgical History: Procedure Laterality Date JOINT REPLACEMENT Left 07/2018 left shoulder HERNIA REPAIR 18/8416 umbilical, Dr. Bary Castilla CATARACT EXTRACTION 2021 APPENDECTOMY CHOLECYSTECTOMY COLONOSCOPY around 2013, bny Dr. Vira Agar knee surgery Right   Social History  Socioeconomic History Marital status: Married Tobacco Use Smoking status: Never Smokeless tobacco: Never Substance and Sexual Activity Alcohol use: Yes Drug use: Never Sexual activity: Defer   Allergies Allergen Reactions Hydrocodone Itching After extended use Oxycodone Itching  Current Outpatient Medications Medication Sig Dispense Refill amLODIPine (NORVASC) 5 MG tablet TAKE 1 TABLET EVERY DAY 90 tablet 1 diclofenac (VOLTAREN) 1 % topical gel Apply 2 g topically 4 (four) times daily as needed ibuprofen (MOTRIN) 800 MG tablet Take 800 mg by mouth every 8 (eight) hours as needed levothyroxine (SYNTHROID) 50 MCG tablet TAKE 1  TABLET ONCE DAILY ON AN EMPTY STOMACH WITH A GLASS OF WATER AT LEAST 30-60 MINUTES BEFORE BREAKFAST. 90 tablet 3 lisinopriL (ZESTRIL) 20 MG tablet TAKE 1 TABLET EVERY DAY 90 tablet 1 sildenafil (REVATIO) 20 mg tablet Take 20 mg by mouth once daily as needed valACYclovir (VALTREX) 1000 MG tablet Take 1,000 mg by mouth as needed  No current facility-administered medications for this visit.  Family History Problem Relation Age of Onset Heart disease Mother Myocardial Infarction (Heart attack) Mother Brain cancer Father Diabetes Sister  Labs and Radiology:  February 16, 2022 laboratory:  W[facility] Blood Cell Count) 4.1 - 10.2 10^3/uL 5.3 RBC (Red Blood Cell Count) 4.69 - 6.13 10^6/uL 4.44 Low Hemoglobin 14.1 - 18.1 gm/dL 13.8 Low Hematocrit 40.0 - 52.0 % 42.2 MCV (Mean Corpuscular Volume) 80.0 - 100.0 fl 95.0 MCH (Mean Corpuscular Hemoglobin) 27.0 - 31.2 pg 31.1 MCHC (Mean Corpuscular Hemoglobin Concentration) 32.0 - 36.0 gm/dL 32.7 Platelet Count 150 - 450 10^3/uL 238 RDW-CV (Red Cell Distribution Width) 11.6 - 14.8 % 12.8 MPV (Mean Platelet Volume) 9.4 - 12.4 fl 10.0 Neutrophils 1.50 - 7.80 10^3/uL 2.44 Lymphocytes 1.00 - 3.60 10^3/uL 2.28 Monocytes 0.00 - 1.50 10^3/uL 0.43 Eosinophils 0.00 - 0.55 10^3/uL 0.09 Basophils 0.00 - 0.09 10^3/uL 0.06 Neutrophil % 32.0 - 70.0 % 46.0 Lymphocyte % 10.0 - 50.0 % 42.9 Monocyte % 4.0 - 13.0 % 8.1 Eosinophil % 1.0 - 5.0 % 1.7 Basophil% 0.0 - 2.0 % 1.1 Immature Granulocyte % <=0.7 % 0.2 Immature Granulocyte Count <=0.06 10^3/L 0.01  Glucose 70 - 110 mg/dL 120 High Sodium 136 - 145 mmol/L 137 Potassium 3.6 - 5.1 mmol/L 4.9 Chloride 97 - 109 mmol/L 101 Carbon Dioxide (CO2) 22.0 -  32.0 mmol/L 28.9 Urea Nitrogen (BUN) 7 - 25 mg/dL 11 Creatinine 0.7 - 1.3 mg/dL 0.9 Glomerular Filtration Rate (eGFR) >60 mL/min/1.73sq m 83 Calcium 8.7 - 10.3 mg/dL 9.6 AST 8 - 39 U/L 26 ALT 6 - 57 U/L 24 Alk Phos (alkaline  Phosphatase) 34 - 104 U/L 61 Albumin 3.5 - 4.8 g/dL 4.7 Bilirubin, Total 0.3 - 1.2 mg/dL 0.8 Protein, Total 6.1 - 7.9 g/dL 7.3 A/G Ratio 1.0 - 5.0 gm/dL 1.8  Review of Systems Constitutional: Negative for chills and fever. Respiratory: Negative for cough.   Objective: Physical Exam Constitutional: Appearance: Normal appearance. Cardiovascular: Rate and Rhythm: Normal rate and regular rhythm. Pulses: Normal pulses. Heart sounds: Murmur heard. Systolic murmur is present with a grade of 1/6. Pulmonary: Effort: Pulmonary effort is normal. Breath sounds: Normal breath sounds. Musculoskeletal: Cervical back: Neck supple. Neurological: Mental Status: He is alert and oriented to person, place, and time. Psychiatric: Mood and Affect: Mood normal. Behavior: Behavior normal.   Assessment:  Candidate for colon cancer screening.  Plan:  Options for screening were reviewed and the patient with no family history of colon cancer negative prior exam (by patient report). Cologuard and colonoscopy are both considered acceptable screening modalities. Sensitivity of Cologuard and 3-year frequency of repeat was reviewed. Potential for false positive exam discussed. Risks of colonoscopy including colonic injury were reviewed.  After long discussion of pros and cons and considering his wife formally was director of the endoscopy unit, I think he is leaning towards colonoscopy.  He was instructed in regards to preparation by the staff.   This note is partially prepared by Ledell Noss, CMA acting as a scribe in the presence of Dr. Hervey Ard, MD.  The documentation recorded by the scribe accurately reflects the service I personally performed and the decisions made by me.  Robert Bellow, MD FACS   Electronically signed by Mayer Masker, MD at 06/12/2022 10:42 AM EST

## 2022-06-25 ENCOUNTER — Other Ambulatory Visit: Payer: Self-pay

## 2022-07-03 ENCOUNTER — Encounter: Payer: Self-pay | Admitting: General Surgery

## 2022-07-03 ENCOUNTER — Other Ambulatory Visit: Payer: Self-pay

## 2022-07-03 MED ORDER — AMOXICILLIN 500 MG PO CAPS
ORAL_CAPSULE | ORAL | 0 refills | Status: DC
  Filled 2022-07-03: qty 4, 1d supply, fill #0

## 2022-07-04 ENCOUNTER — Ambulatory Visit: Payer: Medicare Other | Admitting: Anesthesiology

## 2022-07-04 ENCOUNTER — Encounter: Payer: Self-pay | Admitting: General Surgery

## 2022-07-04 ENCOUNTER — Other Ambulatory Visit: Payer: Self-pay

## 2022-07-04 ENCOUNTER — Ambulatory Visit
Admission: RE | Admit: 2022-07-04 | Discharge: 2022-07-04 | Disposition: A | Discharge status: Home / Self Care | Payer: Medicare Other | Attending: General Surgery | Admitting: General Surgery

## 2022-07-04 ENCOUNTER — Encounter
Admission: RE | Disposition: A | Discharge status: Home / Self Care | Payer: Self-pay | Source: Home / Self Care | Attending: General Surgery

## 2022-07-04 DIAGNOSIS — Z1211 Encounter for screening for malignant neoplasm of colon: Secondary | ICD-10-CM | POA: Insufficient documentation

## 2022-07-04 DIAGNOSIS — D122 Benign neoplasm of ascending colon: Secondary | ICD-10-CM | POA: Insufficient documentation

## 2022-07-04 DIAGNOSIS — K5730 Diverticulosis of large intestine without perforation or abscess without bleeding: Secondary | ICD-10-CM | POA: Diagnosis not present

## 2022-07-04 HISTORY — DX: Hypothyroidism, unspecified: E03.9

## 2022-07-04 HISTORY — PX: COLONOSCOPY WITH PROPOFOL: SHX5780

## 2022-07-04 SURGERY — COLONOSCOPY WITH PROPOFOL
Anesthesia: General

## 2022-07-04 MED ORDER — PROPOFOL 1000 MG/100ML IV EMUL
INTRAVENOUS | Status: AC
  Filled 2022-07-04: qty 500

## 2022-07-04 MED ORDER — GLYCOPYRROLATE 0.2 MG/ML IJ SOLN
INTRAMUSCULAR | Status: DC | PRN
  Administered 2022-07-04: .2 mg via INTRAVENOUS

## 2022-07-04 MED ORDER — SODIUM CHLORIDE 0.9 % IV SOLN: INTRAVENOUS | Status: DC

## 2022-07-04 MED ORDER — LIDOCAINE HCL (CARDIAC) PF 100 MG/5ML IV SOSY
PREFILLED_SYRINGE | INTRAVENOUS | Status: DC | PRN
  Administered 2022-07-04: 100 mg via INTRAVENOUS

## 2022-07-04 MED ORDER — PROPOFOL 10 MG/ML IV BOLUS
INTRAVENOUS | Status: DC | PRN
  Administered 2022-07-04: 70 mg via INTRAVENOUS
  Administered 2022-07-04: 30 mg via INTRAVENOUS

## 2022-07-04 MED ORDER — PROPOFOL 500 MG/50ML IV EMUL
INTRAVENOUS | Status: DC | PRN
  Administered 2022-07-04: 165 ug/kg/min via INTRAVENOUS

## 2022-07-04 NOTE — Anesthesia Preprocedure Evaluation (Signed)
Anesthesia Evaluation  Patient identified by MRN, date of birth, ID band Patient awake    Reviewed: Allergy & Precautions, H&P , NPO status , Patient's Chart, lab work & pertinent test results, reviewed documented beta blocker date and time   History of Anesthesia Complications (+) PONV and history of anesthetic complications  Airway Mallampati: II   Neck ROM: full    Dental  (+) Poor Dentition   Pulmonary neg pulmonary ROS   Pulmonary exam normal        Cardiovascular Exercise Tolerance: Good hypertension, On Medications negative cardio ROS Normal cardiovascular exam Rhythm:regular Rate:Normal     Neuro/Psych  Neuromuscular disease  negative psych ROS   GI/Hepatic negative GI ROS, Neg liver ROS,,,  Endo/Other  negative endocrine ROS    Renal/GU negative Renal ROS  negative genitourinary   Musculoskeletal   Abdominal   Peds  Hematology negative hematology ROS (+)   Anesthesia Other Findings Past Medical History: No date: Arthritis     Comment:  shoulders No date: Complication of anesthesia No date: Hypertension No date: PONV (postoperative nausea and vomiting)     Comment:  after knee surgery 2001 Past Surgical History: No date: APPENDECTOMY 10/06/2019: CATARACT EXTRACTION W/PHACO; Right     Comment:  Procedure: CATARACT EXTRACTION PHACO AND INTRAOCULAR               LENS PLACEMENT (IOC) RIGHT VIVITY LENS 7.89  00:43.4;                Surgeon: Birder Robson, MD;  Location: [place];  Service: Ophthalmology;  Laterality: Right; 11/03/2019: CATARACT EXTRACTION W/PHACO; Left     Comment:  Procedure: CATARACT EXTRACTION PHACO AND INTRAOCULAR               LENS PLACEMENT (IOC) LEFT VIVITY LENS 8.72  00:43.6;                Surgeon: Birder Robson, MD;  Location: [facility];  Service: Ophthalmology;  Laterality: Left; No date: CHOLECYSTECTOMY No date: KNEE  SURGERY; Right No date: SHOULDER SURGERY; Left No date: TOTAL SHOULDER REPLACEMENT 56/11/3327: UMBILICAL HERNIA REPAIR; N/A     Comment:  Procedure: HERNIA REPAIR UMBILICAL ADULT;  Surgeon:               Robert Bellow, MD;  Location: [facility];  Service:               General;  Laterality: N/A;   Reproductive/Obstetrics negative OB ROS                             Anesthesia Physical Anesthesia Plan  ASA: 2  Anesthesia Plan: General   Post-op Pain Management:    Induction:   PONV Risk Score and Plan:   Airway Management Planned:   Additional Equipment:   Intra-op Plan:   Post-operative Plan:   Informed Consent: I have reviewed the patients History and Physical, chart, labs and discussed the procedure including the risks, benefits and alternatives for the proposed anesthesia with the patient or authorized representative who has indicated his/her understanding and acceptance.     Dental Advisory Given  Plan Discussed with: CRNA  Anesthesia Plan Comments:        Anesthesia Quick Evaluation

## 2022-07-04 NOTE — Op Note (Signed)
[facility] Gastroenterology Patient Name: Tyler Sharp Procedure Date: 07/04/2022 7:03 AM MRN: [id number] Account #: [id number] Date of Birth: 1950/12/05 Admit Type: Outpatient Age: 71 Room: [facility] ENDO ROOM 2 Gender: Male Note Status: Finalized Instrument Name: Colonoscope [id number] Procedure:             Colonoscopy Indications:           Screening for colorectal malignant neoplasm Providers:             Robert Bellow, MD Referring MD:          Ramonita Lab, MD (Referring MD) Medicines:             Propofol per Anesthesia Complications:         No immediate complications. Procedure:             Pre-Anesthesia Assessment:                        - Prior to the procedure, a History and Physical was                         performed, and patient medications, allergies and                         sensitivities were reviewed. The patient's tolerance                         of previous anesthesia was reviewed.                        - The risks and benefits of the procedure and the                         sedation options and risks were discussed with the                         patient. All questions were answered and informed                         consent was obtained.                        After obtaining informed consent, the colonoscope was                         passed under direct vision. Throughout the procedure,                         the patient's blood pressure, pulse, and oxygen                         saturations were monitored continuously. The                         Colonoscope was introduced through the anus and                         advanced to the the cecum, identified by appendiceal  orifice and ileocecal valve. The colonoscopy was                         somewhat difficult due to multiple diverticula in the                         colon and significant looping. Successful completion                         of  the procedure was aided by using manual pressure.                         The patient tolerated the procedure well. Findings:      A 5 mm polyp was found in the ascending colon. The polyp was sessile.       The polyp was removed with a cold snare. Resection and retrieval were       complete.      Multiple medium-mouthed diverticula were found in the sigmoid colon.      The retroflexed view of the distal rectum and anal verge was normal and       showed no anal or rectal abnormalities. Impression:            - One 5 mm polyp in the ascending colon, removed with                         a cold snare. Resected and retrieved.                        - Diverticulosis in the sigmoid colon.                        - The distal rectum and anal verge are normal on                         retroflexion view. Recommendation:        - Telephone endoscopist for pathology results in 1                         week. Procedure Code(s):     --- Professional ---                        [phone], Colonoscopy, flexible; with removal of                         tumor(s), polyp(s), or other lesion(s) by snare                         technique Diagnosis Code(s):     --- Professional ---                        Z12.11, Encounter for screening for malignant neoplasm                         of colon                        D12.2, Benign neoplasm of ascending colon  K57.30, Diverticulosis of large intestine without                         perforation or abscess without bleeding CPT copyright 2022 [employer]. All rights reserved. The codes documented in this report are preliminary and upon coder review may  be revised to meet current compliance requirements. Robert Bellow, MD 07/04/2022 8:22:38 AM This report has been signed electronically. Number of Addenda: 0 Note Initiated On: 07/04/2022 7:03 AM Scope Withdrawal Time: 0 hours 7 minutes 52 seconds  Total Procedure Duration:  0 hours 19 minutes 53 seconds  Estimated Blood Loss:  Estimated blood loss: none.      [facility]

## 2022-07-04 NOTE — Transfer of Care (Signed)
Immediate Anesthesia Transfer of Care Note  Patient: Tyler Sharp.  Procedure(s) Performed: COLONOSCOPY WITH PROPOFOL  Patient Location: PACU  Anesthesia Type:General  Level of Consciousness: drowsy and patient cooperative  Airway & Oxygen Therapy: Patient Spontanous Breathing and Patient connected to face mask oxygen  Post-op Assessment: Report given to RN and Post -op Vital signs reviewed and stable  Post vital signs: Reviewed and stable  Last Vitals:  Vitals Value Taken Time  BP 98/75 07/04/22 0824  Temp 36 C 07/04/22 0823  Pulse 84 07/04/22 0824  Resp 9 07/04/22 0828  SpO2 99 % 07/04/22 0824  Vitals shown include unvalidated device data.  Last Pain:  Vitals:   07/04/22 0823  TempSrc: Tympanic  PainSc: Asleep         Complications: No notable events documented.

## 2022-07-04 NOTE — H&P (Signed)
Tyler Sharp [id number] 03-09-1951     HPI:  71 y.o for screening colonoscopy. Tolerated prep well.   Medications Prior to Admission  Medication Sig Dispense Refill Last Dose   amLODipine (NORVASC) 5 MG tablet TAKE 1 TABLET EVERY DAY 90 tablet 0 07/03/2022   amoxicillin (AMOXIL) 500 MG capsule Take four capsules by mouth one hour prior to arrival time for colonoscopy. 4 capsule 0 07/04/2022 at 0600   diclofenac Sodium (VOLTAREN) 1 % GEL Apply 2 g topically 4 (four) times daily as needed (arthritis pain).   07/03/2022   ibuprofen (ADVIL) 800 MG tablet Take 800 mg by mouth every 8 (eight) hours as needed for moderate pain.    Past Week   levothyroxine (SYNTHROID) 50 MCG tablet Take 1 tablet (50 mcg total) by mouth once daily Take on an empty stomach with a glass of water at least 30-60 minutes before breakfast. 90 tablet 3 07/04/2022 at 0600   lisinopril (ZESTRIL) 20 MG tablet TAKE 1 TABLET EVERY DAY 90 tablet 0 07/03/2022   meloxicam (MOBIC) 15 MG tablet Take 1 tablet every day by oral route as directed. 60 tablet 1 07/03/2022   methylPREDNISolone (MEDROL) 4 MG TBPK tablet Take as instructed for 6 days 21 each 0 07/03/2022   methylPREDNISolone (MEDROL) 4 MG TBPK tablet Take as directed on package insert.  Dose Pak 21 tablet 0 07/03/2022   Multiple Vitamins-Minerals (ZINC PO) Take by mouth daily.   07/03/2022   neomycin-polymyxin-dexamethasone (MAXITROL) 0.1 % ophthalmic suspension Apply 1 drop into left eye three times a day 5 mL 0 07/03/2022   polyethylene glycol powder (GLYCOLAX/MIRALAX) 17 GM/SCOOP powder One bottle for colonoscopy prep. Use as directed. 238 g 0 07/03/2022   sildenafil (REVATIO) 20 MG tablet Take 1 tablet (20 mg total) by mouth daily as needed. 50 tablet 5 07/03/2022   traMADol (ULTRAM) 50 MG tablet Take 1 tablet every 6 hours by oral route as needed. 42 tablet 0 07/03/2022   UNABLE TO FIND Take 1 capsule by mouth 2 (two) times daily. Prostagenix   07/03/2022    valACYclovir (VALTREX) 1000 MG tablet Take 1 tablet (1,000 mg total) by mouth 2 (two) times daily as needed. 20 tablet 2 07/03/2022   vitamin B-12 (CYANOCOBALAMIN) 100 MCG tablet Take 100 mcg by mouth daily.   07/03/2022   VITAMIN D PO Take by mouth daily.   07/03/2022   levothyroxine (SYNTHROID) 25 MCG tablet TAKE 1 TABLET BY MOUTH ONCE DAILY TAKE ON AN EMPTY STOMACH WITH A GLASS OF WATER AT LEAST 30-60 MINUTES BEFORE BREAKFAST. 30 tablet 11    Allergies  Allergen Reactions   Hydrocodone Itching    After extended use   Past Medical History:  Diagnosis Date   Arthritis    shoulders   Complication of anesthesia    Hypertension    Hypothyroidism    PONV (postoperative nausea and vomiting)    after knee surgery 2001   Past Surgical History:  Procedure Laterality Date   APPENDECTOMY     CATARACT EXTRACTION W/PHACO Right 10/06/2019   Procedure: CATARACT EXTRACTION PHACO AND INTRAOCULAR LENS PLACEMENT ([place]) RIGHT VIVITY LENS 7.89  00:43.4;  Surgeon: Birder Robson, MD;  Location: [facility];  Service: Ophthalmology;  Laterality: Right;   CATARACT EXTRACTION W/PHACO Left 11/03/2019   Procedure: CATARACT EXTRACTION PHACO AND INTRAOCULAR LENS PLACEMENT (IOC) LEFT VIVITY LENS 8.72  00:43.6;  Surgeon: Birder Robson, MD;  Location: [facility];  Service: Ophthalmology;  Laterality: Left;  CHOLECYSTECTOMY     KNEE SURGERY Right    SHOULDER SURGERY Left    TOTAL SHOULDER REPLACEMENT     UMBILICAL HERNIA REPAIR N/A 07/15/2019   Procedure: HERNIA REPAIR UMBILICAL ADULT;  Surgeon: Robert Bellow, MD;  Location: [facility];  Service: General;  Laterality: N/A;   Social History   Socioeconomic History   Marital status: Married    Spouse name: Not on file   Number of children: Not on file   Years of education: Not on file   Highest education level: Bachelor's degree (e.g., BA, AB, BS)  Occupational History   Occupation: retired  Tobacco Use   Smoking status: Never    Smokeless tobacco: Never  Vaping Use   Vaping Use: Never used  Substance and Sexual Activity   Alcohol use: Yes    Alcohol/week: 2.0 standard drinks of alcohol    Types: 2 Shots of liquor per week    Comment: occasional   Drug use: No   Sexual activity: Not on file  Other Topics Concern   Not on file  Social History Narrative   Not on file   Social Determinants of Health   Financial Resource Strain: Low Risk  (01/15/2018)   Overall Financial Resource Strain (CARDIA)    Difficulty of Paying Living Expenses: Not hard at all  Food Insecurity: No Food Insecurity (01/15/2018)   Hunger Vital Sign    Worried About Running Out of Food in the Last Year: Never true    Ran Out of Food in the Last Year: Never true  Transportation Needs: No Transportation Needs (01/15/2018)   PRAPARE - Hydrologist (Medical): No    Lack of Transportation (Non-Medical): No  Physical Activity: Sufficiently Active (01/22/2019)   Exercise Vital Sign    Days of Exercise per Week: 7 days    Minutes of Exercise per Session: 30 min  Stress: No Stress Concern Present (01/15/2018)   [place]    Feeling of Stress : Not at all  Social Connections: [place] (01/15/2018)   Social Connection and Isolation Panel [NHANES]    Frequency of Communication with Friends and Family: More than three times a week    Frequency of Social Gatherings with Friends and Family: More than three times a week    Attends Religious Services: More than 4 times per year    Active Member of [employer] or Organizations: Yes    Attends Archivist Meetings: More than 4 times per year    Marital Status: Married  Human resources officer Violence: Not At Risk (01/15/2018)   Humiliation, Afraid, Rape, and Kick questionnaire    Fear of Current or Ex-Partner: No    Emotionally Abused: No    Physically Abused: No    Sexually Abused: No   Social  History   Social History Narrative   Not on file     ROS: Negative.     PE: HEENT: Negative. Lungs: Clear. Cardio: RR.   Assessment/Plan:  Proceed with planned endoscopy.  Forest Gleason [facility] 07/04/2022

## 2022-07-04 NOTE — Anesthesia Procedure Notes (Signed)
Procedure Name: General with mask airway Date/Time: 07/04/2022 8:11 AM  Performed by: Kelton Pillar, CRNAPre-anesthesia Checklist: Patient identified, Emergency Drugs available, Suction available and Patient being monitored Patient Re-evaluated:Patient Re-evaluated prior to induction Oxygen Delivery Method: Simple face mask Induction Type: IV induction Placement Confirmation: positive ETCO2, CO2 detector and breath sounds checked- equal and bilateral Dental Injury: Teeth and Oropharynx as per pre-operative assessment

## 2022-07-04 NOTE — Anesthesia Postprocedure Evaluation (Signed)
Anesthesia Post Note  Patient: Tyler Sharp.  Procedure(s) Performed: COLONOSCOPY WITH PROPOFOL  Patient location during evaluation: PACU Anesthesia Type: General Level of consciousness: awake and alert Pain management: pain level controlled Vital Signs Assessment: post-procedure vital signs reviewed and stable Respiratory status: spontaneous breathing, nonlabored ventilation, respiratory function stable and patient connected to nasal cannula oxygen Cardiovascular status: blood pressure returned to baseline and stable Postop Assessment: no apparent nausea or vomiting Anesthetic complications: no   No notable events documented.   Last Vitals:  Vitals:   07/04/22 0823 07/04/22 0833  BP: 98/75 121/85  Pulse: 82 89  Resp: 17 (!) 21  Temp: (!) 36 C   SpO2: 99% 98%    Last Pain:  Vitals:   07/04/22 0833  TempSrc:   PainSc: 0-No pain                 Molli Barrows

## 2022-07-05 ENCOUNTER — Encounter: Payer: Self-pay | Admitting: General Surgery

## 2022-07-05 LAB — SURGICAL PATHOLOGY

## 2022-09-11 DIAGNOSIS — R7303 Prediabetes: Secondary | ICD-10-CM | POA: Insufficient documentation

## 2022-10-18 ENCOUNTER — Other Ambulatory Visit: Payer: Self-pay

## 2022-10-18 MED ORDER — AMOXICILLIN 500 MG PO CAPS
2000.0000 mg | ORAL_CAPSULE | Freq: Once | ORAL | 0 refills | Status: AC
  Filled 2022-10-18: qty 4, 1d supply, fill #0

## 2023-02-13 ENCOUNTER — Other Ambulatory Visit: Payer: Self-pay

## 2023-02-13 MED ORDER — METHYLPREDNISOLONE 4 MG PO TBPK
ORAL_TABLET | ORAL | 0 refills | Status: AC
  Filled 2023-02-13: qty 21, 6d supply, fill #0

## 2023-02-13 MED ORDER — MELOXICAM 15 MG PO TABS
15.0000 mg | ORAL_TABLET | Freq: Every day | ORAL | 0 refills | Status: DC
  Filled 2023-02-13: qty 30, 30d supply, fill #0

## 2023-02-20 ENCOUNTER — Other Ambulatory Visit: Payer: Self-pay

## 2023-02-20 MED ORDER — VALACYCLOVIR HCL 1 G PO TABS
1000.0000 mg | ORAL_TABLET | Freq: Every day | ORAL | 2 refills | Status: DC
  Filled 2023-02-20: qty 4, 4d supply, fill #0

## 2023-03-06 ENCOUNTER — Other Ambulatory Visit: Payer: Self-pay

## 2023-03-06 MED ORDER — OPZELURA 1.5 % EX CREA
TOPICAL_CREAM | CUTANEOUS | 0 refills | Status: AC
  Filled 2023-03-06: qty 60, 30d supply, fill #0

## 2023-03-07 ENCOUNTER — Other Ambulatory Visit: Payer: Self-pay

## 2023-03-15 ENCOUNTER — Other Ambulatory Visit: Payer: Self-pay

## 2023-04-11 DIAGNOSIS — D649 Anemia, unspecified: Secondary | ICD-10-CM | POA: Insufficient documentation

## 2023-05-23 ENCOUNTER — Other Ambulatory Visit: Payer: Self-pay

## 2023-05-23 MED ORDER — AMOXICILLIN 500 MG PO CAPS
2000.0000 mg | ORAL_CAPSULE | ORAL | 0 refills | Status: DC
  Filled 2023-05-23: qty 4, 1d supply, fill #0

## 2023-12-12 ENCOUNTER — Other Ambulatory Visit: Payer: Self-pay

## 2023-12-12 MED ORDER — AMOXICILLIN 500 MG PO CAPS
2000.0000 mg | ORAL_CAPSULE | Freq: Once | ORAL | 0 refills | Status: AC
  Filled 2023-12-12: qty 4, 1d supply, fill #0

## 2024-02-21 ENCOUNTER — Other Ambulatory Visit: Payer: Self-pay

## 2024-02-21 MED ORDER — TACROLIMUS 0.1 % EX OINT
TOPICAL_OINTMENT | Freq: Two times a day (BID) | CUTANEOUS | 3 refills | Status: DC
  Filled 2024-02-21: qty 60, 30d supply, fill #0

## 2024-02-24 ENCOUNTER — Other Ambulatory Visit: Payer: Self-pay

## 2024-05-06 ENCOUNTER — Other Ambulatory Visit: Payer: Self-pay

## 2024-05-06 MED ORDER — PREDNISONE 5 MG PO TABS
ORAL_TABLET | ORAL | 0 refills | Status: AC
  Filled 2024-05-06: qty 21, 6d supply, fill #0

## 2024-05-27 ENCOUNTER — Other Ambulatory Visit: Payer: Self-pay

## 2024-05-27 MED ORDER — GABAPENTIN 100 MG PO CAPS
100.0000 mg | ORAL_CAPSULE | Freq: Every day | ORAL | 1 refills | Status: DC
  Filled 2024-05-27: qty 30, 30d supply, fill #0

## 2024-06-01 ENCOUNTER — Other Ambulatory Visit: Payer: Self-pay | Admitting: Internal Medicine

## 2024-06-01 DIAGNOSIS — M4802 Spinal stenosis, cervical region: Secondary | ICD-10-CM

## 2024-06-03 ENCOUNTER — Ambulatory Visit
Admission: RE | Admit: 2024-06-03 | Discharge: 2024-06-03 | Disposition: A | Discharge status: Home / Self Care | Source: Ambulatory Visit | Attending: Internal Medicine | Admitting: Internal Medicine

## 2024-06-03 DIAGNOSIS — M4802 Spinal stenosis, cervical region: Secondary | ICD-10-CM | POA: Insufficient documentation

## 2024-06-10 ENCOUNTER — Other Ambulatory Visit: Payer: Self-pay

## 2024-06-10 MED ORDER — TRAMADOL HCL 50 MG PO TABS
50.0000 mg | ORAL_TABLET | Freq: Three times a day (TID) | ORAL | 0 refills | Status: AC | PRN
  Filled 2024-06-10: qty 15, 5d supply, fill #0

## 2024-06-16 ENCOUNTER — Other Ambulatory Visit: Payer: Self-pay

## 2024-06-16 MED ORDER — GABAPENTIN 300 MG PO CAPS
ORAL_CAPSULE | ORAL | 1 refills | Status: AC
  Filled 2024-06-16: qty 90, 34d supply, fill #0

## 2024-06-25 ENCOUNTER — Other Ambulatory Visit: Payer: Self-pay

## 2024-06-25 DIAGNOSIS — Z9889 Other specified postprocedural states: Secondary | ICD-10-CM | POA: Insufficient documentation

## 2024-06-25 MED ORDER — AMOXICILLIN 500 MG PO CAPS
2000.0000 mg | ORAL_CAPSULE | Freq: Every day | ORAL | 0 refills | Status: DC
  Filled 2024-06-25: qty 4, 1d supply, fill #0

## 2024-06-25 NOTE — Progress Notes (Signed)
 Referring Physician:  Cyrus Selinda Moose, PA-C [address] [place],  [place] [address]  Primary Physician:  Tyler Ophelia JINNY DOUGLAS, MD  Discussed the use of AI scribe software for clinical note transcription with the patient, who gave verbal consent to proceed.  History of Present Illness Tyler Sharp. BUCK is a 73 year old male with a history of bilateral shoulder replacements who presents with neck and shoulder pain. He is accompanied by his wife, [name].  He first noticed left hand pain on September 10th, described as feeling like he chopped something. Vitamin B6 was started without benefit.  Another physician started gabapentin  100 mg, ordered a cervical MRI due to known degenerative changes on a 2018 neck MRI, and later prescribed tramadol . Gabapentin  was increased to 300 mg on November 11th. He received an injection on October 20th that resolved shooting pains but not tingling.  He has tingling and severe left arm pain, worst in the morning, which he describes as screaming. Pain radiates to the left shoulder blade and upper back. Ibuprofen and ice provide partial relief. Pain worsens with certain positions, especially sleeping on his left side, and improves when he lays his hand flat.  He exercises three times a week and has not noticed weakness. Gabapentin  causes drowsiness. He stopped tramadol  because of concern about interaction with gabapentin .  He has numbness and tingling in the left hand, mainly the pinky and ring fingers. A wrist brace meant for carpal tunnel has not helped.  Conservative measures:  Physical therapy:  has not participated in  Multimodal medical therapy including regular antiinflammatories:  Gabapentin , Ibuprofen, Tylenol , Tramadol , Prednisone , Diclofenac gel Injections:  06/25/2024- TFESI Left C6-7  Past Surgery: no spine surgery  The symptoms are causing a significant impact on the patient's life.   I have utilized the care  everywhere function in epic to review the outside records available from external health systems.  Review of Systems:  A 10 point review of systems is negative, except for the pertinent positives and negatives detailed in the HPI.  Past Medical History: Past Medical History:  Diagnosis Date   Arthritis    shoulders   Complication of anesthesia    Hypertension    Hypothyroidism    PONV (postoperative nausea and vomiting)    after knee surgery 2001    Past Surgical History: Past Surgical History:  Procedure Laterality Date   APPENDECTOMY     CATARACT EXTRACTION W/PHACO Right 10/06/2019   Procedure: CATARACT EXTRACTION PHACO AND INTRAOCULAR LENS PLACEMENT (IOC) RIGHT VIVITY LENS 7.89  00:43.4;  Surgeon: Jaye Fallow, MD;  Location: [facility];  Service: Ophthalmology;  Laterality: Right;   CATARACT EXTRACTION W/PHACO Left 11/03/2019   Procedure: CATARACT EXTRACTION PHACO AND INTRAOCULAR LENS PLACEMENT (IOC) LEFT VIVITY LENS 8.72  00:43.6;  Surgeon: Jaye Fallow, MD;  Location: [facility];  Service: Ophthalmology;  Laterality: Left;   CHOLECYSTECTOMY     COLONOSCOPY WITH PROPOFOL  N/A 07/04/2022   Procedure: COLONOSCOPY WITH PROPOFOL ;  Surgeon: Dessa Reyes ORN, MD;  Location: [facility];  Service: Endoscopy;  Laterality: N/A;   KNEE SURGERY Right    SHOULDER SURGERY Left    TOTAL SHOULDER REPLACEMENT Right    UMBILICAL HERNIA REPAIR N/A 07/15/2019   Procedure: HERNIA REPAIR UMBILICAL ADULT;  Surgeon: Dessa Reyes ORN, MD;  Location: [facility];  Service: General;  Laterality: N/A;    Allergies: Allergies as of 07/01/2024 - Review Complete 07/04/2022  Allergen Reaction Noted   Hydrocodone  Itching  09/30/2019   Oxycodone Itching and Other (See Comments) 09/02/2020    Medications:  Current Outpatient Medications:    amLODipine  (NORVASC ) 5 MG tablet, TAKE 1 TABLET EVERY DAY, Disp: 90 tablet, Rfl: 0   diclofenac Sodium (VOLTAREN) 1 % GEL, Apply 2  g topically 4 (four) times daily as needed (arthritis pain)., Disp: , Rfl:    gabapentin  (NEURONTIN ) 300 MG capsule, Take 1 capsule (300 mg total) by mouth at bedtime for 4 days, THEN 1 capsule (300 mg total) 2 (two) times daily for 4 days, THEN 1 capsule (300 mg total) 3 (three) times daily., Disp: 90 capsule, Rfl: 1   levothyroxine  (SYNTHROID ) 75 MCG tablet, Take 75 mcg by mouth daily before breakfast., Disp: , Rfl:    lisinopril  (ZESTRIL ) 20 MG tablet, TAKE 1 TABLET EVERY DAY, Disp: 90 tablet, Rfl: 0   Multiple Vitamins-Minerals (ZINC PO), Take by mouth daily., Disp: , Rfl:    Ruxolitinib  Phosphate (OPZELURA ) 1.5 % CREA, Apply to affected areas on leg twice daily. Discontinue once skin pigment has returned, Disp: 60 g, Rfl: 0   sildenafil  (REVATIO ) 20 MG tablet, Take 1 tablet (20 mg total) by mouth daily as needed., Disp: 50 tablet, Rfl: 5   valACYclovir  (VALTREX ) 1000 MG tablet, Take 1 tablet (1,000 mg total) by mouth 2 (two) times daily as needed., Disp: 20 tablet, Rfl: 2   VITAMIN D PO, Take by mouth daily., Disp: , Rfl:   Social History: Social History   Tobacco Use   Smoking status: Never   Smokeless tobacco: Never  Vaping Use   Vaping status: Never Used  Substance Use Topics   Alcohol use: Yes    Alcohol/week: 2.0 standard drinks of alcohol    Types: 2 Shots of liquor per week    Comment: occasional   Drug use: No    Family Medical History: Family History  Problem Relation Age of Onset   Heart disease Mother    Heart attack Mother    Brain cancer Father    Diabetes Sister     Physical Examination: Vitals:   07/01/24 1001  BP: 130/76    General: Patient is in no apparent distress. Attention to examination is appropriate.  Neck:   Supple.  Full range of motion.  Respiratory: Patient is breathing without any difficulty.   NEUROLOGICAL:  Awake, alert, oriented to person, place, and time.  Speech is clear and fluent.   Cranial Nerves: Pupils equal round and  reactive to light.  Facial tone is symmetric.  Facial sensation is symmetric. Shoulder shrug is symmetric. Tongue protrusion is midline.    Strength: Side Biceps Triceps Deltoid Interossei Grip Wrist Ext. Wrist Flex.  R 5 5 5 5 5 5 5   L 5 5 5 5 5 5 5   He has very mild intrinsic hand weakness on the left mostly noted in the ADM.  Median musculature was preserved.  Does have a sustained nerve exam from a sensory standpoint but does have a positive Tinel sign.     No evidence of dysmetria noted.  Gait is normal.    Imaging: Narrative & Impression  CLINICAL DATA:  Initial evaluation for neck pain with arm pain.   EXAM: MRI CERVICAL SPINE WITHOUT CONTRAST   TECHNIQUE: Multiplanar, multisequence MR imaging of the cervical spine was performed. No intravenous contrast was administered.   COMPARISON:  Prior MRI from 02/07/2017.   FINDINGS: Alignment: Straightening with slight reversal of the normal cervical lordosis. Trace facet mediated anterolisthesis of  C7 on T1 and T1 on T2.   Vertebrae: Vertebral body height maintained without acute or chronic fracture. Bone marrow signal intensity within normal limits. No worrisome osseous lesions. Degenerative reactive endplate change with mild marrow edema present about the C4-5 through C6-7 interspaces. Reactive marrow edema present about the left greater than right C7-T1 facet due to facet arthritis.   Cord: Normal signal and morphology.   Posterior Fossa, vertebral arteries, paraspinal tissues: Unremarkable.   Disc levels:   C2-C3: Mild uncovertebral spurring without significant disc bulge. Advanced left-sided facet degeneration. No spinal stenosis. Moderate left C3 foraminal narrowing.   C3-C4: Degenerative intervertebral disc space narrowing with diffuse disc osteophyte complex. Posterior component flattens the ventral thecal sac. Superimposed small right paracentral soft disc protrusion noted (series 9, image 14). Mild cord  flattening without cord signal changes. Mild spinal stenosis. Severe right with moderate left C4 foraminal narrowing.   C4-C5: Advanced intervertebral disc space narrowing with bony ankylosis across the C4-5 interspace. Left C4-5 facet is fused as well. No significant spinal stenosis. Mild left C5 foraminal narrowing. Right neural foramina remains patent.   C5-C6: Advanced degenerative intervertebral disc space narrowing with diffuse disc osteophyte complex. Posterior component flattens and indents the ventral thecal sac, slightly asymmetric to the right. Superimposed mild facet and ligament flavum hypertrophy. Resultant severe spinal stenosis with the thecal sac measuring 6-7 mm in AP diameter. Secondary cord flattening without cord signal changes. Severe right worse than left C6 foraminal narrowing.   C6-C7: Degenerative intervertebral disc space narrowing with diffuse disc osteophyte complex. Posterior component flattens and indents the ventral thecal sac, asymmetric to the right. Mild facet and ligament flavum hypertrophy. Resultant severe spinal stenosis with the thecal sac measuring 6 mm in AP diameter. Cord flattening without cord signal changes. Severe right worse than left C7 foraminal narrowing.   C7-T1: Mild intervertebral disc space narrowing with diffuse disc bulge and disc desiccation. Superimposed left subarticular to foraminal disc protrusion (series 8, image 31). Left worse than right facet arthrosis with reactive marrow edema. Mild to moderate spinal stenosis. Mild left C8 foraminal narrowing. Right neural foramina remains patent.   IMPRESSION: 1. Multilevel cervical spondylosis with resultant diffuse spinal stenosis at C3-4 through C7-T1, severe in nature at the C5-6 and C6-7 levels. Secondary cord flattening at these levels without cord signal changes. 2. Multifactorial degenerative changes with resultant multilevel foraminal narrowing as above. Notable  findings include moderate left C3 foraminal stenosis, severe right with moderate left C4 foraminal narrowing, severe right worse than left C6 and C7 foraminal stenosis, with mild left C8 foraminal narrowing. 3. Reactive marrow edema about the left greater than right C7-T1 facets due to facet arthritis. Finding could serve as a source for neck pain.     Electronically Signed   By: Morene Hoard M.D.   On: 06/05/2024 23:21   I have personally reviewed the images and agree with the above interpretation.  Assessment and Plan Assessment & Plan Cervical radiculopathy Chronic cervical radiculopathy with degenerative disc disease and collapsed disc at C6-C7. Symptoms include shooting pains down the arm, improved with recent neck injection. No weakness or significant functional impairment. Surgery not indicated due to lack of weakness and improvement with conservative management. Risks of surgery outweigh benefits at this time. - Continue gabapentin  300 mg TID for neuropathic pain. - Continue tramadol  as needed for pain management. - Follow up with Doctor Dodson on December 3rd to assess symptom progression and discuss potential for further injections or surgical intervention  if symptoms worsen.  Left ulnar neuropathy (cubital tunnel syndrome) Mild left ulnar neuropathy likely due to cubital tunnel syndrome, presenting with tingling and pain in the left arm, particularly affecting the pinky and ring fingers. Symptoms exacerbated by certain positions and activities. No significant weakness, but mild ADM weakness noted. Conservative management preferred over surgical intervention at this time. - Use a cubital tunnel brace at night to prevent elbow flexion. - Consider using a heel bow during the day to protect the elbow from compression. - Continue gabapentin  300 mg TID for neuropathic pain management. - Avoid activities that exacerbate symptoms, such as leaning on the elbow or prolonged  elbow flexion.  Penne MICAEL Sharps MD/MSCR Neurosurgery

## 2024-07-01 ENCOUNTER — Encounter: Payer: Self-pay | Admitting: Neurosurgery

## 2024-07-01 ENCOUNTER — Ambulatory Visit: Admitting: Neurosurgery

## 2024-07-01 ENCOUNTER — Other Ambulatory Visit

## 2024-07-01 VITALS — BP 130/76 | Ht 70.0 in | Wt 217.0 lb

## 2024-07-01 DIAGNOSIS — M50123 Cervical disc disorder at C6-C7 level with radiculopathy: Secondary | ICD-10-CM | POA: Diagnosis not present

## 2024-07-01 DIAGNOSIS — M5412 Radiculopathy, cervical region: Secondary | ICD-10-CM | POA: Diagnosis not present

## 2024-07-01 DIAGNOSIS — M50323 Other cervical disc degeneration at C6-C7 level: Secondary | ICD-10-CM

## 2024-07-01 DIAGNOSIS — G5622 Lesion of ulnar nerve, left upper limb: Secondary | ICD-10-CM | POA: Diagnosis not present

## 2024-07-01 DIAGNOSIS — M47812 Spondylosis without myelopathy or radiculopathy, cervical region: Secondary | ICD-10-CM | POA: Insufficient documentation

## 2024-07-08 ENCOUNTER — Other Ambulatory Visit: Payer: Self-pay

## 2024-07-08 MED ORDER — GABAPENTIN 300 MG PO CAPS
ORAL_CAPSULE | ORAL | 1 refills | Status: AC
  Filled 2024-07-08: qty 180, 30d supply, fill #0
  Filled 2024-07-08: qty 180, 34d supply, fill #0
  Filled 2024-08-14: qty 180, 64d supply, fill #1

## 2024-07-09 ENCOUNTER — Other Ambulatory Visit: Payer: Self-pay | Admitting: Physical Medicine & Rehabilitation

## 2024-07-09 DIAGNOSIS — M5412 Radiculopathy, cervical region: Secondary | ICD-10-CM

## 2024-07-13 ENCOUNTER — Other Ambulatory Visit: Payer: Self-pay

## 2024-07-13 MED ORDER — TRAMADOL HCL 50 MG PO TABS
50.0000 mg | ORAL_TABLET | Freq: Three times a day (TID) | ORAL | 0 refills | Status: AC | PRN
  Filled 2024-07-13: qty 15, 5d supply, fill #0

## 2024-07-16 ENCOUNTER — Other Ambulatory Visit: Payer: Self-pay

## 2024-07-16 MED ORDER — PREDNISONE 10 MG PO TABS
ORAL_TABLET | ORAL | 0 refills | Status: AC
  Filled 2024-07-16: qty 48, 12d supply, fill #0

## 2024-08-14 ENCOUNTER — Other Ambulatory Visit: Payer: Self-pay

## 2024-08-18 ENCOUNTER — Other Ambulatory Visit: Payer: Self-pay

## 2024-08-18 MED ORDER — GABAPENTIN 300 MG PO CAPS
600.0000 mg | ORAL_CAPSULE | Freq: Three times a day (TID) | ORAL | 5 refills | Status: AC
  Filled 2024-08-18: qty 180, 30d supply, fill #0

## 2024-09-04 NOTE — Progress Notes (Unsigned)
 EMG:08/10/2024 ESI: 06/25/2024 Left C6-7

## 2024-09-16 ENCOUNTER — Ambulatory Visit: Admitting: Neurosurgery
